# Patient Record
Sex: Male | Born: 1980
Health system: Southern US, Community
[De-identification: ages and names within clinical notes are randomized; demographics above are authoritative.]

## PROBLEM LIST (undated history)

## (undated) DIAGNOSIS — I10 Essential (primary) hypertension: Secondary | ICD-10-CM

## (undated) HISTORY — DX: Essential (primary) hypertension: I10

---

## 2000-07-04 ENCOUNTER — Encounter: Payer: Self-pay | Admitting: Internal Medicine

## 2000-07-04 ENCOUNTER — Emergency Department (HOSPITAL_COMMUNITY): Admission: EM | Admit: 2000-07-04 | Discharge: 2000-07-04 | Payer: Self-pay | Admitting: Internal Medicine

## 2003-11-18 ENCOUNTER — Emergency Department (HOSPITAL_COMMUNITY): Admission: EM | Admit: 2003-11-18 | Discharge: 2003-11-18 | Payer: Self-pay | Admitting: Emergency Medicine

## 2005-01-09 ENCOUNTER — Emergency Department (HOSPITAL_COMMUNITY): Admission: EM | Admit: 2005-01-09 | Discharge: 2005-01-09 | Payer: Self-pay | Admitting: Emergency Medicine

## 2007-11-27 ENCOUNTER — Emergency Department (HOSPITAL_COMMUNITY): Admission: EM | Admit: 2007-11-27 | Discharge: 2007-11-27 | Payer: Self-pay | Admitting: Emergency Medicine

## 2010-10-27 ENCOUNTER — Emergency Department (HOSPITAL_COMMUNITY)
Admission: EM | Admit: 2010-10-27 | Discharge: 2010-10-27 | Disposition: A | Discharge status: Home / Self Care | Payer: 59 | Attending: Emergency Medicine | Admitting: Emergency Medicine

## 2010-10-27 DIAGNOSIS — L509 Urticaria, unspecified: Secondary | ICD-10-CM | POA: Insufficient documentation

## 2012-09-06 ENCOUNTER — Ambulatory Visit (INDEPENDENT_AMBULATORY_CARE_PROVIDER_SITE_OTHER): Payer: 59 | Admitting: Sports Medicine

## 2012-09-06 ENCOUNTER — Encounter: Payer: Self-pay | Admitting: Sports Medicine

## 2012-09-06 VITALS — BP 160/110 | HR 58 | Wt 199.0 lb

## 2012-09-06 DIAGNOSIS — Z0000 Encounter for general adult medical examination without abnormal findings: Secondary | ICD-10-CM | POA: Insufficient documentation

## 2012-09-06 DIAGNOSIS — Z299 Encounter for prophylactic measures, unspecified: Secondary | ICD-10-CM

## 2012-09-06 DIAGNOSIS — I10 Essential (primary) hypertension: Secondary | ICD-10-CM

## 2012-09-06 DIAGNOSIS — N62 Hypertrophy of breast: Secondary | ICD-10-CM

## 2012-09-06 DIAGNOSIS — R718 Other abnormality of red blood cells: Secondary | ICD-10-CM

## 2012-09-06 DIAGNOSIS — Z87892 Personal history of anaphylaxis: Secondary | ICD-10-CM | POA: Insufficient documentation

## 2012-09-06 DIAGNOSIS — Z23 Encounter for immunization: Secondary | ICD-10-CM

## 2012-09-06 LAB — CBC
HCT: 43.1 % (ref 39.0–52.0)
Hemoglobin: 14.5 g/dL (ref 13.0–17.0)
MCH: 26.2 pg (ref 26.0–34.0)
MCHC: 33.6 g/dL (ref 30.0–36.0)
MCV: 77.8 fL — ABNORMAL LOW (ref 78.0–100.0)
Platelets: 254 10*3/uL (ref 150–400)
RBC: 5.54 MIL/uL (ref 4.22–5.81)
RDW: 14.4 % (ref 11.5–15.5)
WBC: 5 10*3/uL (ref 4.0–10.5)

## 2012-09-06 LAB — LIPID PANEL
Cholesterol: 170 mg/dL (ref 0–200)
HDL: 53 mg/dL (ref >39)
LDL Cholesterol: 99 mg/dL (ref 0–99)
Total CHOL/HDL Ratio: 3.2 Ratio
Triglycerides: 90 mg/dL (ref <150)
VLDL: 18 mg/dL (ref 0–40)

## 2012-09-06 LAB — COMPREHENSIVE METABOLIC PANEL
ALT: 15 U/L (ref 0–53)
AST: 24 U/L (ref 0–37)
Albumin: 4.7 g/dL (ref 3.5–5.2)
Alkaline Phosphatase: 78 U/L (ref 39–117)
BUN: 14 mg/dL (ref 6–23)
CO2: 28 mEq/L (ref 19–32)
Calcium: 10 mg/dL (ref 8.4–10.5)
Chloride: 101 mEq/L (ref 96–112)
Creat: 0.95 mg/dL (ref 0.50–1.35)
Glucose, Bld: 100 mg/dL — ABNORMAL HIGH (ref 70–99)
Potassium: 4.3 mEq/L (ref 3.5–5.3)
Sodium: 137 mEq/L (ref 135–145)
Total Bilirubin: 0.7 mg/dL (ref 0.3–1.2)
Total Protein: 7.6 g/dL (ref 6.0–8.3)

## 2012-09-06 LAB — TSH: TSH: 0.626 u[IU]/mL (ref 0.350–4.500)

## 2012-09-06 MED ORDER — LISINOPRIL-HYDROCHLOROTHIAZIDE 20-25 MG PO TABS: 0.5000 | ORAL_TABLET | Freq: Every day | ORAL | Status: DC

## 2012-09-06 MED ORDER — EPINEPHRINE 0.3 MG/0.3ML IJ DEVI: 0.3000 mg | Freq: Once | INTRAMUSCULAR | Status: DC

## 2012-09-06 NOTE — Assessment & Plan Note (Signed)
Very elevated, starting lisinopril/hydrochlorothiazide combo. Return to clinic in 2 weeks to recheck.

## 2012-09-06 NOTE — Progress Notes (Signed)
Subjective:    CC: Establish care, complete physical, discuss a few issues.   HPI: I met this pleasant gentleman at the [facility]. He comes in to establish care.  Hypertension: Very elevated, has never been treated, strong family history.  Gynecomastia: Notes right is larger than left, slowly growing, but has been present since his teen years. Wondering what can be done. No discharge.  History of anaphylaxis: Usually carries an EpiPen, needs refill on this.  Preventive measure: Needs a Tdap and flu shot.  Past medical history, Surgical history, Family history, Social history, Allergies, and medications have been entered into the medical record, reviewed, and no changes needed.   Review of Systems: No headache, visual changes, nausea, vomiting, diarrhea, constipation, dizziness, abdominal pain, skin rash, fevers, chills, night sweats, swollen lymph nodes, weight loss, chest pain, body aches, joint swelling, muscle aches, shortness of breath, mood changes, visual or auditory hallucinations.  Objective:    General: Well Developed, well nourished, and in no acute distress.  Neuro: Alert and oriented x3, extra-ocular muscles intact.  HEENT: Normocephalic, atraumatic, pupils equal round reactive to light, neck supple, no masses, no lymphadenopathy, thyroid nonpalpable.  Skin: Warm and dry, no rashes noted.  Cardiac: Regular rate and rhythm, no murmurs rubs or gallops.  Respiratory: Clear to auscultation bilaterally. Not using accessory muscles, speaking in full sentences. Gynecomastia bilaterally, right worse than left, nontender, no palpable masses. No discharge. Abdominal: Soft, nontender, nondistended, positive bowel sounds, no masses, no organomegaly.  Musculoskeletal: Shoulder, elbow, wrist, hip, knee, ankle stable, and with full range of motion.  Impression and Recommendations:    The patient was counselled, risk factors were discussed, anticipatory guidance given.

## 2012-09-06 NOTE — Assessment & Plan Note (Signed)
Advised on weight loss strategies. Offered referral to plastic surgery, he will defer this until later.

## 2012-09-06 NOTE — Assessment & Plan Note (Signed)
We will refill his EpiPen.

## 2012-09-07 LAB — VITAMIN D 25 HYDROXY (VIT D DEFICIENCY, FRACTURES): Vit D, 25-Hydroxy: 11 ng/mL — ABNORMAL LOW (ref 30–89)

## 2012-09-07 MED ORDER — VITAMIN D (ERGOCALCIFEROL) 1.25 MG (50000 UNIT) PO CAPS: 50000.0000 [IU] | ORAL_CAPSULE | ORAL | Status: DC

## 2012-09-07 NOTE — Addendum Note (Signed)
Addended by: Monica Becton on: 09/07/2012 08:05 AM   Modules accepted: Orders

## 2012-09-09 ENCOUNTER — Encounter: Payer: Self-pay | Admitting: *Deleted

## 2012-09-09 LAB — TESTOSTERONE, FREE, TOTAL, SHBG
Sex Hormone Binding: 36 nmol/L (ref 13–71)
Testosterone, Free: 100.1 pg/mL (ref 47.0–244.0)
Testosterone-% Free: 2 % (ref 1.6–2.9)
Testosterone: 502.25 ng/dL (ref 300–890)

## 2012-09-12 LAB — HEMOGLOBINOPATHY EVALUATION
Hemoglobin Other: 0 %
Hgb A2 Quant: 2.6 % (ref 2.2–3.2)
Hgb A: 97.4 % (ref 96.8–97.8)
Hgb F Quant: 0 % (ref 0.0–2.0)
Hgb S Quant: 0 %

## 2012-11-04 ENCOUNTER — Telehealth: Payer: Self-pay

## 2012-11-04 NOTE — Telephone Encounter (Signed)
He had an episode of palpitations. He stopped his lisinopril because he believes the Lisinopril was causing it. I tried to schedule patient for an appointment he stated he could not at this time and he would call back.   Awaiting a call back from patient to schedule him for an appointment.

## 2012-12-09 ENCOUNTER — Ambulatory Visit: Payer: 59 | Admitting: Sports Medicine

## 2012-12-18 ENCOUNTER — Encounter (HOSPITAL_COMMUNITY): Payer: Self-pay | Admitting: *Deleted

## 2012-12-18 ENCOUNTER — Emergency Department (HOSPITAL_COMMUNITY)
Admission: EM | Admit: 2012-12-18 | Discharge: 2012-12-18 | Disposition: A | Discharge status: Home / Self Care | Payer: 59 | Attending: Emergency Medicine | Admitting: Emergency Medicine

## 2012-12-18 DIAGNOSIS — R109 Unspecified abdominal pain: Secondary | ICD-10-CM

## 2012-12-18 DIAGNOSIS — Z79899 Other long term (current) drug therapy: Secondary | ICD-10-CM | POA: Insufficient documentation

## 2012-12-18 DIAGNOSIS — R1084 Generalized abdominal pain: Secondary | ICD-10-CM | POA: Insufficient documentation

## 2012-12-18 DIAGNOSIS — I10 Essential (primary) hypertension: Secondary | ICD-10-CM | POA: Insufficient documentation

## 2012-12-18 DIAGNOSIS — R112 Nausea with vomiting, unspecified: Secondary | ICD-10-CM | POA: Insufficient documentation

## 2012-12-18 LAB — URINALYSIS, ROUTINE W REFLEX MICROSCOPIC
Glucose, UA: NEGATIVE mg/dL
Hgb urine dipstick: NEGATIVE
Ketones, ur: 15 mg/dL — AB
Leukocytes, UA: NEGATIVE
pH: 8 (ref 5.0–8.0)

## 2012-12-18 LAB — COMPREHENSIVE METABOLIC PANEL
ALT: 15 U/L (ref 0–53)
AST: 16 U/L (ref 0–37)
Albumin: 3.7 g/dL (ref 3.5–5.2)
CO2: 27 mEq/L (ref 19–32)
Chloride: 102 mEq/L (ref 96–112)
Creatinine, Ser: 0.77 mg/dL (ref 0.50–1.35)
Potassium: 3.7 mEq/L (ref 3.5–5.1)
Sodium: 137 mEq/L (ref 135–145)
Total Bilirubin: 0.6 mg/dL (ref 0.3–1.2)

## 2012-12-18 LAB — CBC WITH DIFFERENTIAL/PLATELET
Basophils Absolute: 0 10*3/uL (ref 0.0–0.1)
Basophils Relative: 0 % (ref 0–1)
Lymphocytes Relative: 5 % — ABNORMAL LOW (ref 12–46)
MCHC: 33.8 g/dL (ref 30.0–36.0)
Neutro Abs: 7 10*3/uL (ref 1.7–7.7)
Neutrophils Relative %: 93 % — ABNORMAL HIGH (ref 43–77)
Platelets: 207 10*3/uL (ref 150–400)
RDW: 13.5 % (ref 11.5–15.5)
WBC: 7.5 10*3/uL (ref 4.0–10.5)

## 2012-12-18 LAB — URINE MICROSCOPIC-ADD ON

## 2012-12-18 MED ORDER — FAMOTIDINE IN NACL 20-0.9 MG/50ML-% IV SOLN
20.0000 mg | Freq: Once | INTRAVENOUS | Status: AC
  Administered 2012-12-18: 20 mg via INTRAVENOUS
  Filled 2012-12-18: qty 50

## 2012-12-18 MED ORDER — HYDROMORPHONE HCL PF 1 MG/ML IJ SOLN
0.5000 mg | Freq: Once | INTRAMUSCULAR | Status: AC
  Administered 2012-12-18: 0.5 mg via INTRAVENOUS
  Filled 2012-12-18: qty 1

## 2012-12-18 MED ORDER — GI COCKTAIL ~~LOC~~
30.0000 mL | Freq: Once | ORAL | Status: AC
  Administered 2012-12-18: 30 mL via ORAL
  Filled 2012-12-18: qty 30

## 2012-12-18 MED ORDER — ONDANSETRON HCL 4 MG/2ML IJ SOLN
4.0000 mg | Freq: Once | INTRAMUSCULAR | Status: AC
  Administered 2012-12-18: 4 mg via INTRAVENOUS
  Filled 2012-12-18: qty 2

## 2012-12-18 MED ORDER — METOCLOPRAMIDE HCL 10 MG PO TABS: 10.0000 mg | ORAL_TABLET | Freq: Three times a day (TID) | ORAL | Status: DC | PRN

## 2012-12-18 MED ORDER — SODIUM CHLORIDE 0.9 % IV SOLN
1000.0000 mL | Freq: Once | INTRAVENOUS | Status: AC
  Administered 2012-12-18: 1000 mL via INTRAVENOUS

## 2012-12-18 NOTE — ED Provider Notes (Signed)
History     CSN: [id number]  Arrival date & time 12/18/12  4098   First MD Initiated Contact with Patient 12/18/12 [phone]      Chief Complaint  Patient presents with  . Emesis  . Nausea  . Abdominal Pain    (Consider location/radiation/quality/duration/timing/severity/associated sxs/prior treatment) HPI  The patient presents with abdominal pain, nausea, vomiting. Symptoms began approximately 12 hours ago. The patient does not recall significant changes in behavior, diet, medication prior to the onset of symptoms.  He did go out to dinner, though to a typical restaurant for him. Since onset he has had diffuse crampy abdominal pain, worse in the lower abdomen there is also nausea with vomiting.  No diarrhea. He continues to have essentially normal bowel movements. He denies fevers, chills, dyspnea.   Past Medical History  Diagnosis Date  . Hypertension     History reviewed. No pertinent past surgical history.  Family History  Problem Relation Age of Onset  . Cancer Mother   . Heart disease Mother   . Hypertension Mother   . Hyperlipidemia Mother   . Stroke Mother   . Diabetes Mother   . Diabetes Maternal Aunt   . Hyperlipidemia Maternal Aunt   . Hypertension Maternal Aunt   . Diabetes Maternal Uncle   . Diabetes Maternal Grandmother   . Hypertension Maternal Grandmother   . Stroke Maternal Grandmother   . Diabetes Maternal Grandfather   . Hypertension Maternal Grandfather   . Stroke Maternal Grandfather     History  Substance Use Topics  . Smoking status: Never Smoker   . Smokeless tobacco: Not on file  . Alcohol Use: Yes     Comment: socially      Review of Systems  Constitutional:       Per HPI, otherwise negative  HENT:       Per HPI, otherwise negative  Respiratory:       Per HPI, otherwise negative  Cardiovascular:       Per HPI, otherwise negative  Gastrointestinal: Positive for nausea, vomiting and abdominal pain. Negative for diarrhea.   Endocrine:       Negative aside from HPI  Genitourinary:       Neg aside from HPI   Musculoskeletal:       Per HPI, otherwise negative  Skin: Negative.   Neurological: Negative for syncope.    Allergies  Review of patient's allergies indicates no known allergies.  Home Medications   Current Outpatient Rx  Name  Route  Sig  Dispense  Refill  . EPINEPHrine (EPIPEN) 0.3 mg/0.3 mL DEVI   Subcutaneous   Inject 0.3 mLs (0.3 mg total) into the skin once.   1 Device   0   . lisinopril-hydrochlorothiazide (PRINZIDE,ZESTORETIC) 20-25 MG per tablet   Oral   Take 0.5 tablets by mouth daily.   30 tablet   3   . Vitamin D, Ergocalciferol, (DRISDOL) 50000 UNITS CAPS   Oral   Take 1 capsule (50,000 Units total) by mouth every 7 (seven) days.   8 capsule   0     BP 151/97  Pulse 90  Temp(Src) 99.2 F (37.3 C) (Oral)  Resp 17  SpO2 99%  Physical Exam  Nursing note and vitals reviewed. Constitutional: He is oriented to person, place, and time. He appears well-developed. No distress.  HENT:  Head: Normocephalic and atraumatic.  Eyes: Conjunctivae and EOM are normal.  Cardiovascular: Normal rate and regular rhythm.   Pulmonary/Chest: Effort normal. No  stridor. No respiratory distress.  Abdominal: Normal appearance and bowel sounds are normal. He exhibits no distension. There is no hepatosplenomegaly or hepatomegaly. There is generalized tenderness. There is no rigidity, no rebound, no guarding and no CVA tenderness.  Musculoskeletal: He exhibits no edema.  Neurological: He is alert and oriented to person, place, and time.  Skin: Skin is warm and dry.  Psychiatric: He has a normal mood and affect.    ED Course  Procedures (including critical care time)  Labs Reviewed  CBC WITH DIFFERENTIAL  COMPREHENSIVE METABOLIC PANEL  LIPASE, BLOOD  URINALYSIS, ROUTINE W REFLEX MICROSCOPIC   No results found.   No diagnosis found.  11:55 AM Pacemaker substantially better.   Labs reviewed with him. He is tolerating liquids. MDM  This young male presents with new abdominal pain.  The patient is afebrile, uncomfortable, but in no distress with a soft, non-peritoneal abdomen.  The patient's symptoms improve substantially with antiemetics, fluids.  Labs are notable for ketonuria, but no suggestion of systemic illness.  Absent fever, distress, with the improvement in his condition he was discharged with return precautions, follow up instructions.  There is low suspicion for acute appendicitis, diverticulitis, or other acute intra-abdominal pathology, given concern for early presentation again, return precautions were provided.   Gerhard Munch, MD 12/18/12 1158

## 2012-12-18 NOTE — ED Notes (Signed)
Pt. Given a urinal. 

## 2012-12-18 NOTE — ED Notes (Signed)
Pt reports n/v, abd pain since last night. Emesis x3 in last 12hrs. Denies diarrhea. Pain worse in lower abd. Has not tried any meds at home. Denies loc, +lightheadedness.

## 2012-12-18 NOTE — ED Notes (Signed)
Pt given water as PO challenge. Reminded of need for urine sample.

## 2012-12-18 NOTE — ED Notes (Signed)
Pt given ginger ale for additional PO challenge. Reports HA. Sts the last time he ate was last night, HA could be from not eating.

## 2012-12-18 NOTE — ED Notes (Signed)
Pt reports nausea is better but still present, endorses burping often. Reports pain is only a headache now. Was able to tolerate water as PO challenge.

## 2013-06-09 ENCOUNTER — Ambulatory Visit (INDEPENDENT_AMBULATORY_CARE_PROVIDER_SITE_OTHER): Payer: 59 | Admitting: Sports Medicine

## 2013-06-09 ENCOUNTER — Encounter: Payer: Self-pay | Admitting: Sports Medicine

## 2013-06-09 VITALS — BP 160/107 | HR 58 | Ht 71.0 in | Wt 202.0 lb

## 2013-06-09 DIAGNOSIS — Z23 Encounter for immunization: Secondary | ICD-10-CM

## 2013-06-09 DIAGNOSIS — Z299 Encounter for prophylactic measures, unspecified: Secondary | ICD-10-CM

## 2013-06-09 DIAGNOSIS — Z0000 Encounter for general adult medical examination without abnormal findings: Secondary | ICD-10-CM

## 2013-06-09 DIAGNOSIS — I10 Essential (primary) hypertension: Secondary | ICD-10-CM

## 2013-06-09 MED ORDER — LISINOPRIL-HYDROCHLOROTHIAZIDE 20-25 MG PO TABS: 0.5000 | ORAL_TABLET | Freq: Every day | ORAL | Status: DC

## 2013-06-09 NOTE — Progress Notes (Signed)
  Subjective:    CC: Complete physical  HPI:  Hypertension: Has not been back for a return visit since starting his blood pressure medicine, he has been out probably for 5 months now. He is asymptomatic, blood pressure is very elevated.  Preventative measures: Due for a flu shot, fasting blood sugar, lipids were good. BMI is in the overweight range.  Past medical history, Surgical history, Family history not pertinant except as noted below, Social history, Allergies, and medications have been entered into the medical record, reviewed, and no changes needed.   Review of Systems: No headache, visual changes, nausea, vomiting, diarrhea, constipation, dizziness, abdominal pain, skin rash, fevers, chills, night sweats, swollen lymph nodes, weight loss, chest pain, body aches, joint swelling, muscle aches, shortness of breath, mood changes, visual or auditory hallucinations.  Objective:    General: Well Developed, well nourished, and in no acute distress.  Neuro: Alert and oriented x3, extra-ocular muscles intact, sensation grossly intact.  HEENT: Normocephalic, atraumatic, pupils equal round reactive to light, neck supple, no masses, no lymphadenopathy, thyroid nonpalpable.  Skin: Warm and dry, no rashes noted.  Cardiac: Regular rate and rhythm, no murmurs rubs or gallops.  Respiratory: Clear to auscultation bilaterally. Not using accessory muscles, speaking in full sentences.  Abdominal: Soft, nontender, nondistended, positive bowel sounds, no masses, no organomegaly.  Musculoskeletal: Shoulder, elbow, wrist, hip, knee, ankle stable, and with full range of motion. Impression and Recommendations:    The patient was counselled, risk factors were discussed, anticipatory guidance given.

## 2013-06-09 NOTE — Assessment & Plan Note (Signed)
Complete physical performed today. Biometrics screening sheet filled out today.

## 2013-06-09 NOTE — Assessment & Plan Note (Signed)
Extremely uncontrolled. He did not followup after blood pressure medicine was prescribed. Refilled blood pressure medicine, he needs to see me every 2 weeks until blood pressure is controlled.

## 2013-06-26 ENCOUNTER — Ambulatory Visit: Payer: 59 | Admitting: Sports Medicine

## 2014-05-06 ENCOUNTER — Ambulatory Visit: Payer: 59 | Admitting: Sports Medicine

## 2014-08-18 ENCOUNTER — Ambulatory Visit: Payer: 59 | Admitting: Sports Medicine

## 2014-08-19 ENCOUNTER — Other Ambulatory Visit: Payer: Self-pay | Admitting: Sports Medicine

## 2014-08-21 ENCOUNTER — Encounter: Payer: 59 | Admitting: Family Medicine

## 2017-03-27 ENCOUNTER — Encounter (HOSPITAL_COMMUNITY): Payer: Self-pay | Admitting: Emergency Medicine

## 2017-03-27 ENCOUNTER — Emergency Department (HOSPITAL_COMMUNITY)
Admission: EM | Admit: 2017-03-27 | Discharge: 2017-03-27 | Disposition: A | Discharge status: Home / Self Care | Payer: 59 | Attending: Emergency Medicine | Admitting: Emergency Medicine

## 2017-03-27 DIAGNOSIS — M791 Myalgia: Secondary | ICD-10-CM | POA: Diagnosis present

## 2017-03-27 DIAGNOSIS — B349 Viral infection, unspecified: Secondary | ICD-10-CM | POA: Insufficient documentation

## 2017-03-27 DIAGNOSIS — I10 Essential (primary) hypertension: Secondary | ICD-10-CM | POA: Insufficient documentation

## 2017-03-27 DIAGNOSIS — Z79899 Other long term (current) drug therapy: Secondary | ICD-10-CM | POA: Diagnosis not present

## 2017-03-27 MED ORDER — IBUPROFEN 200 MG PO TABS
600.0000 mg | ORAL_TABLET | Freq: Once | ORAL | Status: AC
  Administered 2017-03-27: 600 mg via ORAL
  Filled 2017-03-27: qty 3

## 2017-03-27 MED ORDER — ONDANSETRON 4 MG PO TBDP
4.0000 mg | ORAL_TABLET | Freq: Once | ORAL | Status: AC
  Administered 2017-03-27: 4 mg via ORAL
  Filled 2017-03-27: qty 1

## 2017-03-27 MED ORDER — ONDANSETRON HCL 4 MG PO TABS: 4.0000 mg | ORAL_TABLET | Freq: Three times a day (TID) | ORAL | 0 refills | Status: DC | PRN

## 2017-03-27 NOTE — Discharge Instructions (Addendum)
Please read instructions below. You can take 600mg  of advil/ibuprofen every 6 hours as needed for body aches. Drink plenty of water. You can take zofran every 8 hours as needed for nausea. Schedule an appointment with your primary care provider if symptoms persist. Return to the ER for severe headache or neck stiffness, vision changes, high uncontrollable fever, or new or concerning symptoms.

## 2017-03-27 NOTE — ED Triage Notes (Signed)
Patient reports that yesterday he got really hot, sweaty and felt weak with body aches.  Patient reports that he had high blood pressure 167/100 yesterday even after taking HTN meds.  Patient states that today he still having body aches and sore muscles and "already called out of work for today".

## 2017-03-27 NOTE — ED Provider Notes (Signed)
[facility]-EMERGENCY DEPT Provider Note   CSN: [id number] Arrival date & time: 03/27/17  1205     History   Chief Complaint Chief Complaint  Patient presents with  . Generalized Body Aches    HPI Gregory Willis is a 36 y.o. male with past medical history of hypertension.  HPI  Patient presenting with acute onset of persistent generalized myalgias that began yesterday. He states it started with a posterior headache with some bilateral ear fullness and neck pain. Denies vomiting, diarrhea, constipation, melena or hematochezia, sore throat, cough, congestion, chest pain, shortness of breath, rash, vision changes. Has not taken medication for symptoms. States he takes half a tablet of lisinopril once a day for his blood pressure, that has been running a little high the past 2 days at 160/100. Denies recent insect or tick bite. No sick contacts.  Past Medical History:  Diagnosis Date  . Hypertension     Patient Active Problem List   Diagnosis Date Noted  . Hypertension 09/06/2012  . Gynecomastia 09/06/2012  . Preventive measure 09/06/2012  . History of anaphylaxis 09/06/2012    History reviewed. No pertinent surgical history.     Home Medications    Prior to Admission medications   Medication Sig Start Date End Date Taking? Authorizing Provider  acetaminophen (TYLENOL) 500 MG tablet Take 1,000 mg by mouth every 6 (six) hours as needed for mild pain or headache.   Yes [provider]  EPINEPHrine (EPIPEN) 0.3 mg/0.3 mL DEVI Inject 0.3 mLs (0.3 mg total) into the skin once. 09/06/12  Yes Monica Bectonhekkekandam, Thomas J, MD  lisinopril-hydrochlorothiazide (PRINZIDE,ZESTORETIC) 20-25 MG per tablet TAKE ONE-HALF TABLET BY MOUTH ONCE DAILY 08/19/14  Yes Monica Bectonhekkekandam, Thomas J, MD  ondansetron (ZOFRAN) 4 MG tablet Take 1 tablet (4 mg total) by mouth every 8 (eight) hours as needed for nausea or vomiting. 03/27/17   Russo, [place] N, PA-C    Family History Family History  Problem  Relation Age of Onset  . Cancer Mother   . Heart disease Mother   . Hypertension Mother   . Hyperlipidemia Mother   . Stroke Mother   . Diabetes Mother   . Diabetes Maternal Aunt   . Hyperlipidemia Maternal Aunt   . Hypertension Maternal Aunt   . Diabetes Maternal Uncle   . Diabetes Maternal Grandmother   . Hypertension Maternal Grandmother   . Stroke Maternal Grandmother   . Diabetes Maternal Grandfather   . Hypertension Maternal Grandfather   . Stroke Maternal Grandfather     Social History Social History  Substance Use Topics  . Smoking status: Never Smoker  . Smokeless tobacco: Never Used  . Alcohol use Yes     Comment: socially     Allergies   Patient has no known allergies.   Review of Systems Review of Systems  Constitutional: Positive for fever (tmax 102). Negative for appetite change.  HENT: Negative for congestion and sore throat.   Eyes: Negative for photophobia and visual disturbance.  Respiratory: Negative for cough and shortness of breath.   Cardiovascular: Negative for chest pain.  Gastrointestinal: Positive for nausea. Negative for abdominal pain, blood in stool, constipation, diarrhea and vomiting.  Genitourinary: Negative for dysuria and frequency.  Musculoskeletal: Positive for neck pain.  Skin: Negative for rash.  Neurological: Positive for headaches.     Physical Exam Updated Vital Signs BP (!) 136/105 (BP Location: Right Arm)   Pulse (!) 54   Temp 98.2 F (36.8 C) (Oral)   Resp 16  SpO2 97%   Physical Exam  Constitutional: He appears well-developed and well-nourished.  HENT:  Head: Normocephalic and atraumatic.  Right Ear: Tympanic membrane, external ear and ear canal normal.  Left Ear: Tympanic membrane, external ear and ear canal normal.  Nose: Nose normal.  Mouth/Throat: Uvula is midline, oropharynx is clear and moist and mucous membranes are normal. No trismus in the jaw. No uvula swelling.  Eyes: Pupils are equal, round, and  reactive to light. Conjunctivae and EOM are normal.  Neck: Normal range of motion. Neck supple.  No nuchal rigidity  Cardiovascular: Normal rate, regular rhythm, normal heart sounds and intact distal pulses.   No murmur heard. Pulmonary/Chest: Effort normal and breath sounds normal. No respiratory distress. He has no wheezes. He has no rales. He exhibits no tenderness.  Abdominal: Soft. Bowel sounds are normal. He exhibits no distension and no mass. There is no tenderness. There is no rebound and no guarding. No hernia.  Lymphadenopathy:    He has no cervical adenopathy.  Psychiatric: He has a normal mood and affect. His behavior is normal.  Nursing note and vitals reviewed.    ED Treatments / Results  Labs (all labs ordered are listed, but only abnormal results are displayed) Labs Reviewed - No data to display  EKG  EKG Interpretation None       Radiology No results found.  Procedures Procedures (including critical care time)  Medications Ordered in ED Medications  ondansetron (ZOFRAN-ODT) disintegrating tablet 4 mg (4 mg Oral Given 03/27/17 1338)  ibuprofen (ADVIL,MOTRIN) tablet 600 mg (600 mg Oral Given 03/27/17 1338)     Initial Impression / Assessment and Plan / ED Course  I have reviewed the triage vital signs and the nursing notes.  Pertinent labs & imaging results that were available during my care of the patient were reviewed by me and considered in my medical decision making (see chart for details).     Patients presenting with myalgias and headache. Exam reassuring, ENT exam benign, lungs CTAB, abd exam benign. No nuchal rigidity. Pt is afebrile, nontoxic. Pt with improvement in symptoms after advil and zofran. Discussed that antibiotics are not indicated for viral infections. Pt will be discharged with symptomatic treatment.  Verbalizes understanding and is agreeable with plan. Pt is hemodynamically stable & in NAD prior to dc.  Patient discussed with and  seen by Dr. Adriana Simas.  Discussed results, findings, treatment and follow up. Patient advised of return precautions. Patient verbalized understanding and agreed with plan.  Final Clinical Impressions(s) / ED Diagnoses   Final diagnoses:  Viral illness    New Prescriptions Discharge Medication List as of 03/27/2017  3:20 PM    START taking these medications   Details  ondansetron (ZOFRAN) 4 MG tablet Take 1 tablet (4 mg total) by mouth every 8 (eight) hours as needed for nausea or vomiting., Starting Tue 03/27/2017, Print         Timothy Lasso [place] N, PA-C 03/27/17 1556    Donnetta Hutching, MD 03/28/17 1311

## 2017-05-17 ENCOUNTER — Encounter: Payer: Self-pay | Admitting: Family Medicine

## 2017-05-17 ENCOUNTER — Encounter (INDEPENDENT_AMBULATORY_CARE_PROVIDER_SITE_OTHER): Payer: Self-pay

## 2017-05-17 ENCOUNTER — Ambulatory Visit (INDEPENDENT_AMBULATORY_CARE_PROVIDER_SITE_OTHER): Payer: 59 | Admitting: Family Medicine

## 2017-05-17 VITALS — BP 155/100 | HR 56 | Temp 98.1°F | Resp 16 | Ht 71.65 in | Wt 207.4 lb

## 2017-05-17 DIAGNOSIS — M791 Myalgia, unspecified site: Secondary | ICD-10-CM

## 2017-05-17 DIAGNOSIS — S76211A Strain of adductor muscle, fascia and tendon of right thigh, initial encounter: Secondary | ICD-10-CM | POA: Diagnosis not present

## 2017-05-17 MED ORDER — ACETAMINOPHEN 500 MG PO TABS: 1000.0000 mg | ORAL_TABLET | Freq: Four times a day (QID) | ORAL | 0 refills | Status: DC | PRN

## 2017-05-17 MED ORDER — CYCLOBENZAPRINE HCL 10 MG PO TABS: 10.0000 mg | ORAL_TABLET | Freq: Three times a day (TID) | ORAL | 0 refills | Status: DC | PRN

## 2017-05-17 NOTE — Patient Instructions (Addendum)
Try a heat pad or aspercreme with lidocaine  Adductor Muscle Strain With Rehab Ask your health care provider which exercises are safe for you. Do exercises exactly as told by your health care provider and adjust them as directed. It is normal to feel mild stretching, pulling, tightness, or discomfort as you do these exercises, but you should stop right away if you feel sudden pain or your pain gets worse.Do not begin these exercises until told by your health care provider. Strengthening exercises These exercises build strength and endurance in your thigh. Endurance is the ability to use your muscles for a long time, even after your muscles get tired. Exercise A: Hip adductor isometrics  1. Sit on a firm chair that positions your knees at about the same height as your hips. 2. Place a large ball, firm pillow, or rolled-up bath towel between your thighs. 3. Squeeze your thighs together, gradually building tension. 4. Hold for __________ seconds. 5. Release the tension gradually. Allow your inner thigh muscles to relax completely before you start the next repetition. Repeat __________ times. Complete this exercise __________ times a day. Exercise B: Straight leg raises ( hip adductors) 1. Lie on your side so your head, shoulder, knee, and hip are in a straight line with each other. To help maintain your balance, you may put the foot of your top leg in front of the leg that is on the floor. Your left / right leg should be on the bottom. 2. Roll your hips slightly forward so your hips are stacked directly over each other and your left / right knee is facing forward. 3. Tense the muscles of your inner thigh and lift your bottom leg 4-6 inches (10-15 cm). 4. Hold this position for __________ seconds. 5. Slowly lower your leg to the starting position. 6. Allow the muscles to relax completely before you start the next repetition. Repeat __________ times. Complete this exercise __________ times a  day. Exercise C: Straight leg raises ( hip extensors) 1. Lie on your belly on a bed or a firm surface with a pillow under your hips. 2. Tense your buttock muscles and lift your left / right thigh off the bed. Your left / right knee can be bent or straight, but do not let your back arch. 3. Hold this position for __________ seconds. 4. Slowly return to the starting position. 5. Allow your muscles to relax completely before you start the next repetition. Repeat __________ times. Complete this exercise __________ times a day. Balance exercises These exercises improve or maintain your balance. Balance is important in preventing falls. Exercise D: Single-leg balance 1. Stand near a railing or in a door frame that you can hold onto as needed. 2. Stand on your left / right foot. Keep your big toe down on the floor and try to keep your arch lifted. 3. If this is too easy, you can stand with your eyes closed or stand on a pillow. 4. Hold this position for __________ seconds. Repeat __________ times. Complete this exercise __________ times a day. Exercise E: Side lunges 1. Stand with your feet together. 2. Keeping one foot in place, step to the side with your other foot about __________ inches (__________ cm). Do not step so far that you feel discomfort. 3. Push off from your stepping foot to return to the starting position. Repeat __________ times on each leg. Complete this exercise __________ times a day. This information is not intended to replace advice given to you by  your health care provider. Make sure you discuss any questions you have with your health care provider. Document Released: 08/28/2005 Document Revised: 05/01/2016 Document Reviewed: 06/01/2015 Elsevier Interactive Patient Education  [employer].

## 2017-05-17 NOTE — Progress Notes (Signed)
Chief Complaint  Patient presents with  . Leg Pain    patient states that he began having some R side leg numbness after sitting at work for 2 hours, states yesterday he felt some pain and heard a "pop" while putting on shoes, difficulty walking now    HPI  Pt with leg pain that was on the right sie of the leg with numbness Sitting at work for 2 hours aggravated this Yesterday he had some pain  Heard a pop when he was putting his shoes on Now he is limping No trauma or falls  Past Medical History:  Diagnosis Date  . Hypertension     Current Outpatient Medications  Medication Sig Dispense Refill  . acetaminophen (TYLENOL) 500 MG tablet Take 2 tablets (1,000 mg total) by mouth every 6 (six) hours as needed for mild pain or headache. 30 tablet 0  . EPINEPHrine (EPIPEN) 0.3 mg/0.3 mL DEVI Inject 0.3 mLs (0.3 mg total) into the skin once. 1 Device 0  . lisinopril-hydrochlorothiazide (PRINZIDE,ZESTORETIC) 20-25 MG per tablet TAKE ONE-HALF TABLET BY MOUTH ONCE DAILY 30 tablet 0  . cyclobenzaprine (FLEXERIL) 10 MG tablet Take 1 tablet (10 mg total) by mouth 3 (three) times daily as needed for muscle spasms. 30 tablet 0  . ondansetron (ZOFRAN) 4 MG tablet Take 1 tablet (4 mg total) by mouth every 8 (eight) hours as needed for nausea or vomiting. (Patient not taking: Reported on 05/17/2017) 12 tablet 0   No current facility-administered medications for this visit.     Allergies: No Known Allergies  No past surgical history on file.  Social History   Socioeconomic History  . Marital status: Single    Spouse name: None  . Number of children: None  . Years of education: None  . Highest education level: None  Social Needs  . Financial resource strain: None  . Food insecurity - worry: None  . Food insecurity - inability: None  . Transportation needs - medical: None  . Transportation needs - non-medical: None  Occupational History  . None  Tobacco Use  . Smoking status: Never  Smoker  . Smokeless tobacco: Never Used  Substance and Sexual Activity  . Alcohol use: Yes    Comment: socially  . Drug use: No  . Sexual activity: Not Currently  Other Topics Concern  . None  Social History Narrative  . None    ROS  Objective: Vitals:   05/17/17 1702  BP: (!) 155/100  Pulse: (!) 56  Resp: 16  Temp: 98.1 F (36.7 C)  TempSrc: Oral  SpO2: 98%  Weight: 207 lb 6.4 oz (94.1 kg)  Height: 5' 11.65" (1.82 m)    Physical Exam  Constitutional: He is oriented to person, place, and time. He appears well-developed and well-nourished.  HENT:  Head: Normocephalic and atraumatic.  Cardiovascular: Normal rate, regular rhythm and normal heart sounds.  Pulmonary/Chest: Effort normal and breath sounds normal. No stridor. No respiratory distress.  Musculoskeletal:       Right upper leg: He exhibits tenderness. He exhibits no bony tenderness, no swelling, no edema, no deformity and no laceration.       Left upper leg: Normal. He exhibits no tenderness, no bony tenderness and no swelling.  Neurological: He is alert and oriented to person, place, and time.    Assessment and Plan Carmon was seen today for leg pain.  Diagnoses and all orders for this visit:  Muscle pain Groin strain, right, initial encounter  Other orders -  Discontinue: cyclobenzaprine (FLEXERIL) 10 MG tablet; Take 1 tablet (10 mg total) by mouth 3 (three) times daily as needed for muscle spasms. -     acetaminophen (TYLENOL) 500 MG tablet; Take 2 tablets (1,000 mg total) by mouth every 6 (six) hours as needed for mild pain or headache. -     cyclobenzaprine (FLEXERIL) 10 MG tablet; Take 1 tablet (10 mg total) by mouth 3 (three) times daily as needed for muscle spasms.     Lewanda Perea A Aleksandra Raben

## 2017-08-09 ENCOUNTER — Emergency Department (HOSPITAL_BASED_OUTPATIENT_CLINIC_OR_DEPARTMENT_OTHER)
Admission: EM | Admit: 2017-08-09 | Discharge: 2017-08-09 | Disposition: A | Discharge status: Home / Self Care | Payer: 59 | Attending: Emergency Medicine | Admitting: Emergency Medicine

## 2017-08-09 ENCOUNTER — Encounter (HOSPITAL_BASED_OUTPATIENT_CLINIC_OR_DEPARTMENT_OTHER): Payer: Self-pay | Admitting: *Deleted

## 2017-08-09 ENCOUNTER — Other Ambulatory Visit: Payer: Self-pay

## 2017-08-09 DIAGNOSIS — Y999 Unspecified external cause status: Secondary | ICD-10-CM | POA: Diagnosis not present

## 2017-08-09 DIAGNOSIS — Y939 Activity, unspecified: Secondary | ICD-10-CM | POA: Insufficient documentation

## 2017-08-09 DIAGNOSIS — X500XXA Overexertion from strenuous movement or load, initial encounter: Secondary | ICD-10-CM | POA: Diagnosis not present

## 2017-08-09 DIAGNOSIS — S76801A Unspecified injury of other specified muscles, fascia and tendons at thigh level, right thigh, initial encounter: Secondary | ICD-10-CM | POA: Diagnosis present

## 2017-08-09 DIAGNOSIS — I10 Essential (primary) hypertension: Secondary | ICD-10-CM | POA: Diagnosis not present

## 2017-08-09 DIAGNOSIS — Z79899 Other long term (current) drug therapy: Secondary | ICD-10-CM | POA: Insufficient documentation

## 2017-08-09 DIAGNOSIS — Y929 Unspecified place or not applicable: Secondary | ICD-10-CM | POA: Diagnosis not present

## 2017-08-09 DIAGNOSIS — S76211A Strain of adductor muscle, fascia and tendon of right thigh, initial encounter: Secondary | ICD-10-CM

## 2017-08-09 MED ORDER — NAPROXEN 500 MG PO TABS: 500.0000 mg | ORAL_TABLET | Freq: Two times a day (BID) | ORAL | 0 refills | Status: DC

## 2017-08-09 MED ORDER — CYCLOBENZAPRINE HCL 10 MG PO TABS: 10.0000 mg | ORAL_TABLET | Freq: Two times a day (BID) | ORAL | 0 refills | Status: DC | PRN

## 2017-08-09 MED FILL — CYCLOBENZAPRINE 10 MG TABLE: 10 | 10 days supply | Qty: 20 | Fill #0

## 2017-08-09 MED FILL — NAPROXEN 500 MG TABLET: 500 | 15 days supply | Qty: 30 | Fill #0

## 2017-08-09 NOTE — ED Provider Notes (Signed)
[facility] EMERGENCY DEPARTMENT Provider Note   CSN: [id number] Arrival date & time: 08/09/17  1130     History   Chief Complaint Chief Complaint  Patient presents with  . Leg Pain    HPI Gregory Willis is a 36 y.o. male.  HPI Gregory Willis is a 36 y.o. male with history of hypertension, presents to emergency department complaining of right groin pain.  Patient states he was at work, when suddenly started having pain in the right groin.  He states he had similar pain several weeks ago, was treated with muscle relaxants and Tylenol.  He states pain did improve.  He denies any injuries.  He denies any swelling in the groin.  No hematuria.  No trouble having bowel movements.  No fever or chills.  No penile discharge.  He states it does hurt when he flexes his right hip, when he walks, when he moves his leg.  He denies history of hernias.  He denies any pain or swelling in his scrotum.  He took his last leftover muscle relaxant prior to coming in which did not help.  Past Medical History:  Diagnosis Date  . Hypertension     Patient Active Problem List   Diagnosis Date Noted  . Hypertension 09/06/2012  . Gynecomastia 09/06/2012  . Preventive measure 09/06/2012  . History of anaphylaxis 09/06/2012    History reviewed. No pertinent surgical history.     Home Medications    Prior to Admission medications   Medication Sig Start Date End Date Taking? Authorizing Provider  lisinopril-hydrochlorothiazide (PRINZIDE,ZESTORETIC) 20-25 MG per tablet TAKE ONE-HALF TABLET BY MOUTH ONCE DAILY 08/19/14  Yes Monica Bectonhekkekandam, Thomas J, MD  acetaminophen (TYLENOL) 500 MG tablet Take 2 tablets (1,000 mg total) by mouth every 6 (six) hours as needed for mild pain or headache. 05/17/17   Doristine BosworthStallings, Zoe A, MD  cyclobenzaprine (FLEXERIL) 10 MG tablet Take 1 tablet (10 mg total) by mouth 3 (three) times daily as needed for muscle spasms. 05/17/17   Doristine BosworthStallings, Zoe A, MD  EPINEPHrine (EPIPEN)  0.3 mg/0.3 mL DEVI Inject 0.3 mLs (0.3 mg total) into the skin once. 09/06/12   Monica Bectonhekkekandam, Thomas J, MD  ondansetron (ZOFRAN) 4 MG tablet Take 1 tablet (4 mg total) by mouth every 8 (eight) hours as needed for nausea or vomiting. Patient not taking: Reported on 05/17/2017 03/27/17   Robinson, [place]az[place] N, PA-C    Family History Family History  Problem Relation Age of Onset  . Cancer Mother   . Heart disease Mother   . Hypertension Mother   . Hyperlipidemia Mother   . Stroke Mother   . Diabetes Mother   . Diabetes Maternal Aunt   . Hyperlipidemia Maternal Aunt   . Hypertension Maternal Aunt   . Diabetes Maternal Uncle   . Diabetes Maternal Grandmother   . Hypertension Maternal Grandmother   . Stroke Maternal Grandmother   . Diabetes Maternal Grandfather   . Hypertension Maternal Grandfather   . Stroke Maternal Grandfather     Social History Social History   Tobacco Use  . Smoking status: Never Smoker  . Smokeless tobacco: Never Used  Substance Use Topics  . Alcohol use: Yes    Comment: socially  . Drug use: No     Allergies   Patient has no known allergies.   Review of Systems Review of Systems  Constitutional: Negative for chills and fever.  Gastrointestinal: Negative for abdominal pain, constipation, nausea and vomiting.  Genitourinary: Negative  for difficulty urinating, discharge, dysuria, hematuria, penile pain, penile swelling, scrotal swelling and testicular pain.  Musculoskeletal: Positive for arthralgias and myalgias. Negative for back pain.  Skin: Negative for color change.  Neurological: Negative for weakness and numbness.  All other systems reviewed and are negative.    Physical Exam Updated Vital Signs BP 134/88   Pulse (!) 57   Temp 98.6 F (37 C) (Oral)   Resp 16   Ht 5\' 11"  (1.803 m)   Wt 90.7 kg (200 lb)   SpO2 100%   BMI 27.89 kg/m   Physical Exam  Constitutional: He appears well-developed and well-nourished. No distress.  HENT:    Head: Normocephalic and atraumatic.  Eyes: Conjunctivae are normal.  Neck: Neck supple.  Cardiovascular: Normal rate, regular rhythm and normal heart sounds.  Pulmonary/Chest: Effort normal. No respiratory distress. He has no wheezes. He has no rales.  Abdominal: Soft. Bowel sounds are normal. He exhibits no distension and no mass. There is no tenderness. There is no rebound and no guarding.  Genitourinary:  Genitourinary Comments: No swelling or tenderness of her scrotum.  No inguinal lymphadenopathy.  Musculoskeletal: He exhibits no edema.  Normal-appearing right groin.  Tenderness to palpation over anterior lateral groin, over hip flexor muscles.  Pain with active flexion of the right hip.  No pain with passive right hip flexion.  Neurological: He is alert.  Skin: Skin is warm and dry.  Nursing note and vitals reviewed.    ED Treatments / Results  Labs (all labs ordered are listed, but only abnormal results are displayed) Labs Reviewed - No data to display  EKG  EKG Interpretation None       Radiology No results found.  Procedures Procedures (including critical care time)  Medications Ordered in ED Medications - No data to display   Initial Impression / Assessment and Plan / ED Course  I have reviewed the triage vital signs and the nursing notes.  Pertinent labs & imaging results that were available during my care of the patient were reviewed by me and considered in my medical decision making (see chart for details).     Patient in the emergency department with recurrent right groin pain.  Pain is worsened with flexion of the right hip.  No injuries.  On exam, no palpable hernias present.  He is not having abdominal pain.  No nausea or vomiting.  No difficulty going to the bathroom.  No urinary symptoms.  There is no swelling or pain in the scrotum.  There is no lymphadenopathy in the groin.  His tenderness is directly over the flexor muscles.  I suspect he may be  having muscle spasms.  Will place back on the muscle relaxant, NSAIDs, will have him do heating pads, stretches, follow-up with family doctor.  Return precautions discussed.  I do not think he needs any emergent imaging or labs at this time.  Vitals:   08/09/17 1139 08/09/17 1140  BP:  134/88  Pulse:  (!) 57  Resp:  16  Temp:  98.6 F (37 C)  TempSrc:  Oral  SpO2:  100%  Weight: 90.7 kg (200 lb)   Height: 5\' 11"  (1.803 m)      Final Clinical Impressions(s) / ED Diagnoses   Final diagnoses:  Groin strain, right, initial encounter    ED Discharge Orders    None       Jaynie CrumbleKirichenko, Pocahontas Cohenour, PA-C 08/09/17 1231    Melene PlanFloyd, Dan, DO 08/09/17 1431

## 2017-08-09 NOTE — Discharge Instructions (Signed)
Heating pads to the area.  Take naproxen as prescribed for pain and inflammation.  Continue Flexeril.  Try stretching the area.  If you notice any swelling to the area, difficulty going to the bathroom, blood in your urine, fever chills, nausea or vomiting, any concerning symptoms in your leg, please return back to emergency department.

## 2017-08-09 NOTE — ED Triage Notes (Signed)
Pain in his right upper leg today. He fell due to pain. No injury. States he had the same pain 2 months ago and was seen at a [facility] facility without cause found.

## 2019-04-05 ENCOUNTER — Ambulatory Visit
Admission: EM | Admit: 2019-04-05 | Discharge: 2019-04-05 | Disposition: A | Discharge status: Home / Self Care | Payer: 59 | Attending: Physician Assistant | Admitting: Physician Assistant

## 2019-04-05 ENCOUNTER — Other Ambulatory Visit: Payer: Self-pay

## 2019-04-05 ENCOUNTER — Other Ambulatory Visit
Admission: RE | Admit: 2019-04-05 | Discharge: 2019-04-05 | Disposition: A | Discharge status: Home / Self Care | Payer: 59 | Source: Ambulatory Visit | Attending: Physician Assistant | Admitting: Physician Assistant

## 2019-04-05 DIAGNOSIS — R1013 Epigastric pain: Secondary | ICD-10-CM

## 2019-04-05 DIAGNOSIS — I10 Essential (primary) hypertension: Secondary | ICD-10-CM | POA: Diagnosis not present

## 2019-04-05 LAB — COMPREHENSIVE METABOLIC PANEL
ALT: 43 U/L (ref 0–44)
AST: 24 U/L (ref 15–41)
Albumin: 4.3 g/dL (ref 3.5–5.0)
Alkaline Phosphatase: 75 U/L (ref 38–126)
Anion gap: 16 — ABNORMAL HIGH (ref 5–15)
BUN: 18 mg/dL (ref 6–20)
CO2: 22 mmol/L (ref 22–32)
Calcium: 9.5 mg/dL (ref 8.9–10.3)
Chloride: 99 mmol/L (ref 98–111)
Creatinine, Ser: 0.95 mg/dL (ref 0.61–1.24)
GFR calc Af Amer: >60 mL/min (ref >60)
GFR calc non Af Amer: >60 mL/min (ref >60)
Glucose, Bld: 84 mg/dL (ref 70–99)
Potassium: 4.2 mmol/L (ref 3.5–5.1)
Sodium: 137 mmol/L (ref 135–145)
Total Bilirubin: 0.9 mg/dL (ref 0.3–1.2)
Total Protein: 8.4 g/dL — ABNORMAL HIGH (ref 6.5–8.1)

## 2019-04-05 LAB — LIPASE, BLOOD: Lipase: 26 U/L (ref 11–51)

## 2019-04-05 MED ORDER — DICYCLOMINE HCL 20 MG PO TABS: 20.0000 mg | ORAL_TABLET | Freq: Two times a day (BID) | ORAL | 0 refills | Status: DC

## 2019-04-05 NOTE — ED Triage Notes (Signed)
Per pt on Wednesday night he started having abdominal pain with a fever. Went away the next day and has not had fevers since but still hurting in his umbilical area. Pt says feels like someone is twisting out a rag in his stomach. No nausea, no diarrhea,. No fevers now.Pain does not radiate.

## 2019-04-05 NOTE — ED Provider Notes (Signed)
Gregory Willis    CSN: [id number] Arrival date & time: 04/05/19  1445     History   Chief Complaint Chief Complaint  Patient presents with  . Abdominal Pain    HPI Gregory Willis is a 38 y.o. male.   38 year old male comes in for 3 day history of abdominal pain. States 3 days ago, had severe periumbilical pain that was cramping in sensation, causing trouble walking. He had a fever of 102.7. States fever resolved after tylenol and has not had one since then. Abdominal pain slightly eased off and has been improving since. However, he has not tried to eat since then due to the continued pain. He denies nausea/vomiting. Denies diarrhea/constipation. Had bowel movement 3 days ago that was normal for him. States abdominal pain now is intermittent, worse with movement/eating, better with laying down. Denies radiation of pain. Denies URI symptoms such as cough, congestion, sore throat. Denies chills, body aches. Denies heavy lifting. Denies history of abdominal surgeries. Denies changes in medications. Denies drug use. Denies excessive alcohol use, chronic NSAID use.      Past Medical History:  Diagnosis Date  . Hypertension     Patient Active Problem List   Diagnosis Date Noted  . Hypertension 09/06/2012  . Gynecomastia 09/06/2012  . Preventive measure 09/06/2012  . History of anaphylaxis 09/06/2012    No past surgical history on file.     Home Medications    Prior to Admission medications   Medication Sig Start Date End Date Taking? Authorizing Provider  acetaminophen (TYLENOL) 500 MG tablet Take 2 tablets (1,000 mg total) by mouth every 6 (six) hours as needed for mild pain or headache. 05/17/17   Forrest Moron, MD  cyclobenzaprine (FLEXERIL) 10 MG tablet Take 1 tablet (10 mg total) by mouth 2 (two) times daily as needed for muscle spasms. 08/09/17   Kirichenko, Tatyana, PA-C  dicyclomine (BENTYL) 20 MG tablet Take 1 tablet (20 mg total) by mouth 2 (two) times  daily. 04/05/19   Tasia Catchings, Amy V, PA-C  EPINEPHrine (EPIPEN) 0.3 mg/0.3 mL DEVI Inject 0.3 mLs (0.3 mg total) into the skin once. 09/06/12   Silverio Decamp, MD  lisinopril-hydrochlorothiazide (PRINZIDE,ZESTORETIC) 20-25 MG per tablet TAKE ONE-HALF TABLET BY MOUTH ONCE DAILY 08/19/14   Silverio Decamp, MD  naproxen (NAPROSYN) 500 MG tablet Take 1 tablet (500 mg total) by mouth 2 (two) times daily. 08/09/17   Kirichenko, Tatyana, PA-C  ondansetron (ZOFRAN) 4 MG tablet Take 1 tablet (4 mg total) by mouth every 8 (eight) hours as needed for nausea or vomiting. Patient not taking: Reported on 05/17/2017 03/27/17   Robinson, [place] N, PA-C    Family History Family History  Problem Relation Age of Onset  . Cancer Mother   . Heart disease Mother   . Hypertension Mother   . Hyperlipidemia Mother   . Stroke Mother   . Diabetes Mother   . Diabetes Maternal Aunt   . Hyperlipidemia Maternal Aunt   . Hypertension Maternal Aunt   . Diabetes Maternal Uncle   . Diabetes Maternal Grandmother   . Hypertension Maternal Grandmother   . Stroke Maternal Grandmother   . Diabetes Maternal Grandfather   . Hypertension Maternal Grandfather   . Stroke Maternal Grandfather     Social History Social History   Tobacco Use  . Smoking status: Never Smoker  . Smokeless tobacco: Never Used  Substance Use Topics  . Alcohol use: Yes    Comment: socially  .  Drug use: No     Allergies   Patient has no known allergies.   Review of Systems Review of Systems  Reason unable to perform ROS: See HPI as above.     Physical Exam Triage Vital Signs ED Triage Vitals  Enc Vitals Group     BP 04/05/19 1451 (!) 144/102     Pulse Rate 04/05/19 1451 87     Resp 04/05/19 1451 16     Temp 04/05/19 1451 97.9 F (36.6 C)     Temp Source 04/05/19 1451 Oral     SpO2 04/05/19 1451 98 %     Weight --      Height --      Head Circumference --      Peak Flow --      Pain Score 04/05/19 1453 6     Pain Loc  --      Pain Edu? --      Excl. in GC? --    No data found.  Updated Vital Signs BP (!) 144/102 (BP Location: Left Arm)   Pulse 87   Temp 97.9 F (36.6 C) (Oral)   Resp 16   SpO2 98%   Physical Exam Constitutional:      General: He is not in acute distress.    Appearance: He is well-developed.  HENT:     Head: Normocephalic and atraumatic.  Cardiovascular:     Rate and Rhythm: Normal rate and regular rhythm.     Heart sounds: Normal heart sounds. No murmur. No friction rub. No gallop.   Pulmonary:     Effort: Pulmonary effort is normal.     Breath sounds: Normal breath sounds. No wheezing or rales.  Abdominal:     General: Bowel sounds are normal.     Palpations: Abdomen is soft.     Tenderness: There is abdominal tenderness in the epigastric area. There is no right CVA tenderness, left CVA tenderness, guarding or rebound.  Skin:    General: Skin is warm and dry.  Neurological:     Mental Status: He is alert and oriented to person, place, and time.  Psychiatric:        Behavior: Behavior normal.        Judgment: Judgment normal.    UC Treatments / Results  Labs (all labs ordered are listed, but only abnormal results are displayed) Labs Reviewed  COMPREHENSIVE METABOLIC PANEL  LIPASE    EKG   Radiology No results found.  Procedures Procedures (including critical Willis time)  Medications Ordered in UC Medications - No data to display  Initial Impression / Assessment and Plan / UC Course  I have reviewed the triage vital signs and the nursing notes.  Pertinent labs & imaging results that were available during my Willis of the patient were reviewed by me and considered in my medical decision making (see chart for details).    No alarming signs on exam at this time. Patient afebrile without antipyretics. Epigastric tenderness without guarding or rebound. +BS. No abdominal surgeries in the past. Will draw CMP/lipase for further evaluation and discussed  symptomatic treatment. Strict return precautions given. Patient expresses understanding and agrees to plan.  Final Clinical Impressions(s) / UC Diagnoses   Final diagnoses:  Abdominal pain, epigastric    ED Prescriptions    Medication Sig Dispense Auth. Provider   dicyclomine (BENTYL) 20 MG tablet Take 1 tablet (20 mg total) by mouth 2 (two) times daily. 20 tablet Belinda FisherYu, Amy V, PA-C  Belinda FisherYu, Amy V, PA-C 04/05/19 [phone]

## 2019-04-05 NOTE — Discharge Instructions (Signed)
Labs drawn. Start bentyl for abdominal cramping. Continue to stay hydrated, urine should be clear to pale yellow in color. Bland diet, advance as tolerated. Follow up with PCP if symptoms not improving, but not worsening. Go to the ED if severe abdominal pain, causing trouble walking/jumping, vomiting, return of fever.

## 2019-04-06 LAB — COMPREHENSIVE METABOLIC PANEL
ALT: 43 U/L (ref 0–44)
AST: 24 U/L (ref 15–41)
Albumin: 4.3 g/dL (ref 3.5–5.0)
Alkaline Phosphatase: 75 U/L (ref 38–126)
BUN: 18 mg/dL (ref 6–20)
Bilirubin Total: 0.9 mg/dL (ref 0.3–1.2)
CO2: 22 mmol/L (ref 22–32)
Calcium: 9.5 mg/dL (ref 8.9–10.3)
Chloride: 99 mmol/L (ref 98–111)
Creatinine, Ser: 0.95 mg/dL (ref 0.61–1.24)
GFR calc Af Amer: >50 mL/min — ABNORMAL LOW (ref >60)
GFR calc non Af Amer: >60 mL/min (ref >60)
Glucose: 84 mg/dL (ref 70–99)
Potassium: 4.2 mmol/L (ref 3.5–5.1)
Sodium: 137 mmol/L (ref 135–145)
Total Protein: 8.4 g/dL — ABNORMAL HIGH (ref 6.5–8.1)

## 2019-04-06 LAB — LIPASE: Lipase: 26 U/L (ref 11–51)

## 2019-04-07 ENCOUNTER — Telehealth (HOSPITAL_COMMUNITY): Payer: Self-pay | Admitting: Emergency Medicine

## 2019-04-07 ENCOUNTER — Telehealth: Payer: Self-pay | Admitting: Sports Medicine

## 2019-04-07 NOTE — Telephone Encounter (Signed)
Labs unremarkable, pt contacted and made aware, is waiting for notification from his PCP for follow up. Pt encouraged to return to be reseen if unable to see PCP and having continued symptoms. All questions answered.

## 2019-04-07 NOTE — Telephone Encounter (Signed)
Okay to schedule him as a new physical.

## 2019-04-07 NOTE — Telephone Encounter (Signed)
Patient states he ran into you at the [place] at [place] and wanted to re-establish with you. States that you told him it would be okay and to do a physical. Please advise.

## 2019-04-08 NOTE — Telephone Encounter (Signed)
Appointment has been made. No further questions at this time.  

## 2019-04-22 ENCOUNTER — Encounter: Payer: Self-pay | Admitting: Sports Medicine

## 2019-04-22 ENCOUNTER — Ambulatory Visit (INDEPENDENT_AMBULATORY_CARE_PROVIDER_SITE_OTHER): Payer: 59 | Admitting: Sports Medicine

## 2019-04-22 ENCOUNTER — Other Ambulatory Visit: Payer: Self-pay

## 2019-04-22 ENCOUNTER — Ambulatory Visit (INDEPENDENT_AMBULATORY_CARE_PROVIDER_SITE_OTHER): Payer: 59

## 2019-04-22 DIAGNOSIS — M25551 Pain in right hip: Secondary | ICD-10-CM

## 2019-04-22 DIAGNOSIS — Z299 Encounter for prophylactic measures, unspecified: Secondary | ICD-10-CM

## 2019-04-22 DIAGNOSIS — I10 Essential (primary) hypertension: Secondary | ICD-10-CM

## 2019-04-22 MED ORDER — CELECOXIB 200 MG PO CAPS
ORAL_CAPSULE | ORAL | 2 refills | Status: DC
Start: 2019-04-22 — End: 2020-08-24

## 2019-04-22 MED ORDER — EPINEPHRINE 0.3 MG/0.3ML IJ SOAJ
0.3000 mg | INTRAMUSCULAR | 11 refills | Status: AC | PRN
Start: 2019-04-22 — End: ?

## 2019-04-22 MED ORDER — LISINOPRIL-HYDROCHLOROTHIAZIDE 10-12.5 MG PO TABS
1.0000 | ORAL_TABLET | Freq: Every day | ORAL | 1 refills | Status: DC
Start: 2019-04-22 — End: 2020-04-24
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
## 2019-04-22 NOTE — Assessment & Plan Note (Signed)
I suspect he has a right hip labral tear. Adding x-rays today, hip rehabilitation exercises, there is no evidence of a hip flexor strain. Celebrex. If no better in a month we will proceed with MR arthrogram of the right hip.

## 2019-04-22 NOTE — Assessment & Plan Note (Signed)
Refilling blood pressure medication, well controlled.

## 2019-04-22 NOTE — Addendum Note (Signed)
Addended by: Silverio Decamp on: 04/22/2019 12:02 PM   Modules accepted: Orders

## 2019-04-23 LAB — COMPLETE METABOLIC PANEL WITH GFR
AG Ratio: 1.4 (calc) (ref 1.0–2.5)
ALT: 23 U/L (ref 9–46)
AST: 18 U/L (ref 10–40)
Albumin: 4.6 g/dL (ref 3.6–5.1)
Alkaline phosphatase (APISO): 65 U/L (ref 36–130)
BUN: 15 mg/dL (ref 7–25)
CO2: 30 mmol/L (ref 20–32)
Calcium: 10.3 mg/dL (ref 8.6–10.3)
Chloride: 101 mmol/L (ref 98–110)
Creat: 1.07 mg/dL (ref 0.60–1.35)
GFR, Est African American: 101 mL/min/{1.73_m2} (ref >=60)
GFR, Est Non African American: 87 mL/min/{1.73_m2} (ref >=60)
Globulin: 3.2 g/dL (calc) (ref 1.9–3.7)
Glucose, Bld: 94 mg/dL (ref 65–99)
Potassium: 4.7 mmol/L (ref 3.5–5.3)
Sodium: 139 mmol/L (ref 135–146)
Total Bilirubin: 0.6 mg/dL (ref 0.2–1.2)
Total Protein: 7.8 g/dL (ref 6.1–8.1)

## 2019-04-23 LAB — CBC
HCT: 42.9 % (ref 38.5–50.0)
Hemoglobin: 13.9 g/dL (ref 13.2–17.1)
MCH: 25.9 pg — ABNORMAL LOW (ref 27.0–33.0)
MCHC: 32.4 g/dL (ref 32.0–36.0)
MCV: 80 fL (ref 80.0–100.0)
MPV: 9.8 fL (ref 7.5–12.5)
Platelets: 302 10*3/uL (ref 140–400)
RBC: 5.36 10*6/uL (ref 4.20–5.80)
RDW: 13.2 % (ref 11.0–15.0)
WBC: 4.7 10*3/uL (ref 3.8–10.8)

## 2019-04-23 LAB — TSH: TSH: 0.61 mIU/L (ref 0.40–4.50)

## 2019-04-23 LAB — LIPID PANEL W/REFLEX DIRECT LDL
Cholesterol: 163 mg/dL (ref <200)
HDL: 46 mg/dL (ref >=40)
LDL Cholesterol (Calc): 100 mg/dL (calc) — ABNORMAL HIGH
Non-HDL Cholesterol (Calc): 117 mg/dL (calc) (ref <130)
Total CHOL/HDL Ratio: 3.5 (calc) (ref <5.0)
Triglycerides: 77 mg/dL (ref <150)

## 2019-04-23 LAB — HEMOGLOBIN A1C
Hgb A1c MFr Bld: 5.8 % of total Hgb — ABNORMAL HIGH (ref <5.7)
Mean Plasma Glucose: 120 (calc)
eAG (mmol/L): 6.6 (calc)

## 2019-04-23 LAB — HIV ANTIBODY (ROUTINE TESTING W REFLEX): HIV 1&2 Ab, 4th Generation: NONREACTIVE

## 2019-04-23 LAB — VITAMIN D 25 HYDROXY (VIT D DEFICIENCY, FRACTURES): Vit D, 25-Hydroxy: 17 ng/mL — ABNORMAL LOW (ref 30–100)

## 2019-04-23 MED ORDER — VITAMIN D (ERGOCALCIFEROL) 1.25 MG (50000 UNIT) PO CAPS: 50000.0000 [IU] | ORAL_CAPSULE | ORAL | 0 refills | Status: DC

## 2019-04-23 NOTE — Addendum Note (Signed)
Addended by: Silverio Decamp on: 04/23/2019 09:29 AM   Modules accepted: Orders

## 2019-07-22 ENCOUNTER — Encounter: Payer: Self-pay | Admitting: Sports Medicine

## 2019-07-23 NOTE — Telephone Encounter (Signed)
Appointment has been made. No further questions at this time.  

## 2019-07-28 ENCOUNTER — Other Ambulatory Visit: Payer: Self-pay

## 2019-07-28 ENCOUNTER — Ambulatory Visit: Payer: 59

## 2019-07-28 ENCOUNTER — Ambulatory Visit (INDEPENDENT_AMBULATORY_CARE_PROVIDER_SITE_OTHER): Payer: 59 | Admitting: Osteopathic Medicine

## 2019-07-28 DIAGNOSIS — Z23 Encounter for immunization: Secondary | ICD-10-CM | POA: Diagnosis not present

## 2020-01-16 ENCOUNTER — Ambulatory Visit (INDEPENDENT_AMBULATORY_CARE_PROVIDER_SITE_OTHER): Payer: 59 | Admitting: Sports Medicine

## 2020-01-16 ENCOUNTER — Other Ambulatory Visit: Payer: Self-pay

## 2020-01-16 DIAGNOSIS — M25551 Pain in right hip: Secondary | ICD-10-CM | POA: Diagnosis not present

## 2020-01-16 MED ORDER — TRAMADOL HCL 50 MG PO TABS: 50.0000 mg | ORAL_TABLET | Freq: Three times a day (TID) | ORAL | 0 refills | Status: DC | PRN

## 2020-01-16 NOTE — Progress Notes (Signed)
    Procedures performed today:    None.  Independent interpretation of notes and tests performed by another provider:   None.  Brief History, Exam, Impression, and Recommendations:    Right hip pain This is a very pleasant 39 year old male, he returns with recurrence of right hip pain. I last saw him in August for this, symptoms were consistent with osteoarthritis/a labral tear. Celebrex seem to work, he is now having recurrence of pain, Celebrex has helped just a little bit, I am going to add tramadol for breakthrough pain. I'd like him to do this for 1 to 2 weeks and if insufficient improvement we will proceed with MR arthrogram with steroid injection. I am also going to have him do some hip rehabilitation exercises.    ___________________________________________ Ihor Austin. Benjamin Stain, M.D., ABFM., CAQSM. Primary Care and Sports Medicine [facility]  Adjunct Instructor of Family Medicine  [employer]

## 2020-01-16 NOTE — Assessment & Plan Note (Signed)
This is a very pleasant 39 year old male, he returns with recurrence of right hip pain. I last saw him in August for this, symptoms were consistent with osteoarthritis/a labral tear. Celebrex seem to work, he is now having recurrence of pain, Celebrex has helped just a little bit, I am going to add tramadol for breakthrough pain. I'd like him to do this for 1 to 2 weeks and if insufficient improvement we will proceed with MR arthrogram with steroid injection. I am also going to have him do some hip rehabilitation exercises.

## 2020-01-20 ENCOUNTER — Ambulatory Visit: Payer: 59 | Admitting: Sports Medicine

## 2020-01-30 ENCOUNTER — Ambulatory Visit: Payer: 59 | Admitting: Sports Medicine

## 2020-04-24 ENCOUNTER — Other Ambulatory Visit: Payer: Self-pay | Admitting: Sports Medicine

## 2020-04-24 DIAGNOSIS — I10 Essential (primary) hypertension: Secondary | ICD-10-CM

## 2020-05-06 ENCOUNTER — Encounter: Payer: 59 | Admitting: Sports Medicine

## 2020-05-19 ENCOUNTER — Encounter: Payer: 59 | Admitting: Sports Medicine

## 2020-07-26 ENCOUNTER — Ambulatory Visit (INDEPENDENT_AMBULATORY_CARE_PROVIDER_SITE_OTHER): Payer: 59 | Admitting: Sports Medicine

## 2020-07-26 ENCOUNTER — Other Ambulatory Visit: Payer: Self-pay

## 2020-07-26 VITALS — BP 166/95 | HR 61 | Ht 71.0 in | Wt 225.0 lb

## 2020-07-26 DIAGNOSIS — Z113 Encounter for screening for infections with a predominantly sexual mode of transmission: Secondary | ICD-10-CM | POA: Insufficient documentation

## 2020-07-26 DIAGNOSIS — Z23 Encounter for immunization: Secondary | ICD-10-CM | POA: Diagnosis not present

## 2020-07-26 DIAGNOSIS — I10 Essential (primary) hypertension: Secondary | ICD-10-CM

## 2020-07-26 DIAGNOSIS — Z0000 Encounter for general adult medical examination without abnormal findings: Secondary | ICD-10-CM | POA: Diagnosis not present

## 2020-07-26 DIAGNOSIS — K580 Irritable bowel syndrome with diarrhea: Secondary | ICD-10-CM

## 2020-07-26 MED ORDER — VALSARTAN-HYDROCHLOROTHIAZIDE 160-25 MG PO TABS: 1.0000 | ORAL_TABLET | Freq: Every day | ORAL | 3 refills | Status: DC

## 2020-07-26 MED ORDER — HYOSCYAMINE SULFATE ER 0.375 MG PO TB12: ORAL_TABLET | ORAL | 0 refills | Status: DC

## 2020-07-26 NOTE — Assessment & Plan Note (Signed)
Gregory Willis gets very nervous before flying, he tends to get significant GI spasm, tenesmus and need to have an explosive bowel movement. Going to add Levbid for him to take morning of flights to help block his GI spasm.

## 2020-07-26 NOTE — Assessment & Plan Note (Addendum)
Continues to be elevated even on recheck. Switching to valsartan/HCTZ 160/25

## 2020-07-26 NOTE — Assessment & Plan Note (Signed)
Adding a full STD screen per patient request.   No symptoms.

## 2020-07-26 NOTE — Patient Instructions (Signed)

## 2020-07-26 NOTE — Assessment & Plan Note (Addendum)
Annual physical exam as above. Order routine labs. He did drop off some paperwork for [place] to fill out after we get his labs.

## 2020-07-26 NOTE — Progress Notes (Signed)
Subjective:    CC: Annual Physical Exam  HPI:  This patient is here for their annual physical  I reviewed the past medical history, family history, social history, surgical history, and allergies today and no changes were needed.  Please see the problem list section below in epic for further details.  Past Medical History: Past Medical History:  Diagnosis Date  . Hypertension    Past Surgical History: No past surgical history on file. Social History: Social History   Socioeconomic History  . Marital status: Single    Spouse name: Not on file  . Number of children: Not on file  . Years of education: Not on file  . Highest education level: Not on file  Occupational History  . Not on file  Tobacco Use  . Smoking status: Never Smoker  . Smokeless tobacco: Never Used  Vaping Use  . Vaping Use: Never used  Substance and Sexual Activity  . Alcohol use: Yes    Comment: socially  . Drug use: No  . Sexual activity: Not Currently  Other Topics Concern  . Not on file  Social History Narrative  . Not on file   Social Determinants of Health   Financial Resource Strain:   . Difficulty of Paying Living Expenses: Not on file  Food Insecurity:   . Worried About Programme researcher, broadcasting/film/video in the Last Year: Not on file  . Ran Out of Food in the Last Year: Not on file  Transportation Needs:   . Lack of Transportation (Medical): Not on file  . Lack of Transportation (Non-Medical): Not on file  Physical Activity:   . Days of Exercise per Week: Not on file  . Minutes of Exercise per Session: Not on file  Stress:   . Feeling of Stress : Not on file  Social Connections:   . Frequency of Communication with Friends and Family: Not on file  . Frequency of Social Gatherings with Friends and Family: Not on file  . Attends Religious Services: Not on file  . Active Member of Clubs or Organizations: Not on file  . Attends Banker Meetings: Not on file  . Marital Status: Not on  file   Family History: Family History  Problem Relation Age of Onset  . Cancer Mother   . Heart disease Mother   . Hypertension Mother   . Hyperlipidemia Mother   . Stroke Mother   . Diabetes Mother   . Diabetes Maternal Aunt   . Hyperlipidemia Maternal Aunt   . Hypertension Maternal Aunt   . Diabetes Maternal Uncle   . Diabetes Maternal Grandmother   . Hypertension Maternal Grandmother   . Stroke Maternal Grandmother   . Diabetes Maternal Grandfather   . Hypertension Maternal Grandfather   . Stroke Maternal Grandfather    Allergies: No Known Allergies Medications: See med rec.  Review of Systems: No headache, visual changes, nausea, vomiting, diarrhea, constipation, dizziness, abdominal pain, skin rash, fevers, chills, night sweats, swollen lymph nodes, weight loss, chest pain, body aches, joint swelling, muscle aches, shortness of breath, mood changes, visual or auditory hallucinations.  Objective:    General: Well Developed, well nourished, and in no acute distress.  Neuro: Alert and oriented x3, extra-ocular muscles intact, sensation grossly intact. Cranial nerves II through XII are intact, motor, sensory, and coordinative functions are all intact. HEENT: Normocephalic, atraumatic, pupils equal round reactive to light, neck supple, no masses, no lymphadenopathy, thyroid nonpalpable. Oropharynx, nasopharynx, external ear canals are unremarkable. Skin:  Warm and dry, no rashes noted.  Cardiac: Regular rate and rhythm, no murmurs rubs or gallops.  Respiratory: Clear to auscultation bilaterally. Not using accessory muscles, speaking in full sentences.  Abdominal: Soft, nontender, nondistended, positive bowel sounds, no masses, no organomegaly.  Musculoskeletal: Shoulder, elbow, wrist, hip, knee, ankle stable, and with full range of motion.  Impression and Recommendations:    The patient was counselled, risk factors were discussed, anticipatory guidance given.  Annual  physical exam Annual physical exam as above. Order routine labs. He did drop off some paperwork for [place] to fill out after we get his labs.  Screen for STD (sexually transmitted disease) Adding a full STD screen per patient request.   No symptoms.  Irritable bowel syndrome with diarrhea Gregory Willis gets very nervous before flying, he tends to get significant GI spasm, tenesmus and need to have an explosive bowel movement. Going to add Levbid for him to take morning of flights to help block his GI spasm.   Hypertension Continues to be elevated even on recheck. Switching to valsartan/HCTZ 160/25   ___________________________________________ Ihor Austin. Benjamin Stain, M.D., ABFM., CAQSM. Primary Care and Sports Medicine [facility]  Adjunct Professor of Family Medicine  [employer]

## 2020-07-27 LAB — CBC
HCT: 43.5 % (ref 38.5–50.0)
Hemoglobin: 14.7 g/dL (ref 13.2–17.1)
MCH: 27 pg (ref 27.0–33.0)
MCHC: 33.8 g/dL (ref 32.0–36.0)
MCV: 80 fL (ref 80.0–100.0)
MPV: 9.9 fL (ref 7.5–12.5)
Platelets: 276 10*3/uL (ref 140–400)
RBC: 5.44 10*6/uL (ref 4.20–5.80)
RDW: 13.4 % (ref 11.0–15.0)
WBC: 5 10*3/uL (ref 3.8–10.8)

## 2020-07-27 LAB — COMPREHENSIVE METABOLIC PANEL
AG Ratio: 1.5 (calc) (ref 1.0–2.5)
ALT: 26 U/L (ref 9–46)
AST: 24 U/L (ref 10–40)
Albumin: 4.8 g/dL (ref 3.6–5.1)
Alkaline phosphatase (APISO): 79 U/L (ref 36–130)
BUN: 10 mg/dL (ref 7–25)
CO2: 31 mmol/L (ref 20–32)
Calcium: 10 mg/dL (ref 8.6–10.3)
Chloride: 98 mmol/L (ref 98–110)
Creat: 0.84 mg/dL (ref 0.60–1.35)
Globulin: 3.2 g/dL (calc) (ref 1.9–3.7)
Glucose, Bld: 88 mg/dL (ref 65–99)
Potassium: 3.7 mmol/L (ref 3.5–5.3)
Sodium: 137 mmol/L (ref 135–146)
Total Bilirubin: 0.7 mg/dL (ref 0.2–1.2)
Total Protein: 8 g/dL (ref 6.1–8.1)

## 2020-07-27 LAB — LIPID PANEL
Cholesterol: 214 mg/dL — ABNORMAL HIGH (ref <200)
HDL: 78 mg/dL (ref >=40)
LDL Cholesterol (Calc): 113 mg/dL (calc) — ABNORMAL HIGH
Non-HDL Cholesterol (Calc): 136 mg/dL (calc) — ABNORMAL HIGH (ref <130)
Total CHOL/HDL Ratio: 2.7 (calc) (ref <5.0)
Triglycerides: 123 mg/dL (ref <150)

## 2020-07-27 LAB — HEMOGLOBIN A1C
Hgb A1c MFr Bld: 5.8 % of total Hgb — ABNORMAL HIGH (ref <5.7)
Mean Plasma Glucose: 120 (calc)
eAG (mmol/L): 6.6 (calc)

## 2020-07-27 LAB — TSH: TSH: 1.97 mIU/L (ref 0.40–4.50)

## 2020-07-27 LAB — C. TRACHOMATIS/N. GONORRHOEAE RNA
C. trachomatis RNA, TMA: NOT DETECTED
N. gonorrhoeae RNA, TMA: NOT DETECTED

## 2020-07-27 LAB — RPR: RPR Ser Ql: NONREACTIVE

## 2020-07-27 LAB — HIV ANTIBODY (ROUTINE TESTING W REFLEX): HIV 1&2 Ab, 4th Generation: NONREACTIVE

## 2020-07-27 LAB — HEPATITIS PANEL, ACUTE
Hep A IgM: NONREACTIVE
Hep B C IgM: NONREACTIVE
Hepatitis B Surface Ag: NONREACTIVE
Hepatitis C Ab: NONREACTIVE
SIGNAL TO CUT-OFF: 0.12 (ref <1.00)

## 2020-08-10 ENCOUNTER — Other Ambulatory Visit: Payer: Self-pay

## 2020-08-10 ENCOUNTER — Ambulatory Visit (INDEPENDENT_AMBULATORY_CARE_PROVIDER_SITE_OTHER): Payer: 59 | Admitting: Sports Medicine

## 2020-08-10 VITALS — BP 118/72 | HR 72 | Ht 71.0 in | Wt 222.0 lb

## 2020-08-10 DIAGNOSIS — I10 Essential (primary) hypertension: Secondary | ICD-10-CM

## 2020-08-10 NOTE — Progress Notes (Signed)
   Subjective:    Patient ID: Gregory Willis, male    DOB: Feb 24, 1981, 39 y.o.   MRN: [id number]  HPI Patient is here for blood pressure check. Medication was changed at last OV to valsartan-hctz 160/25 mg. Denies trouble sleeping, palpitations, or medication problems. Has been taking medication daily as prescribed.    Review of Systems     Objective:   Physical Exam        Assessment & Plan:  Patient's blood pressure was in range today at 118/72. Patient doing well on new medication, no complaints.

## 2020-08-24 ENCOUNTER — Encounter: Payer: Self-pay | Admitting: Physician Assistant

## 2020-08-24 ENCOUNTER — Other Ambulatory Visit: Payer: Self-pay

## 2020-08-24 ENCOUNTER — Ambulatory Visit (INDEPENDENT_AMBULATORY_CARE_PROVIDER_SITE_OTHER): Payer: 59 | Admitting: Physician Assistant

## 2020-08-24 ENCOUNTER — Ambulatory Visit (INDEPENDENT_AMBULATORY_CARE_PROVIDER_SITE_OTHER): Payer: 59

## 2020-08-24 VITALS — BP 130/74 | HR 72 | Ht 71.0 in | Wt 222.0 lb

## 2020-08-24 DIAGNOSIS — N644 Mastodynia: Secondary | ICD-10-CM | POA: Diagnosis not present

## 2020-08-24 DIAGNOSIS — K625 Hemorrhage of anus and rectum: Secondary | ICD-10-CM | POA: Diagnosis not present

## 2020-08-24 DIAGNOSIS — M940 Chondrocostal junction syndrome [Tietze]: Secondary | ICD-10-CM | POA: Insufficient documentation

## 2020-08-24 DIAGNOSIS — R079 Chest pain, unspecified: Secondary | ICD-10-CM | POA: Diagnosis not present

## 2020-08-24 MED ORDER — CELECOXIB 200 MG PO CAPS: ORAL_CAPSULE | ORAL | 2 refills | Status: DC

## 2020-08-24 MED ORDER — DICLOFENAC SODIUM 1 % EX GEL: 4.0000 g | Freq: Four times a day (QID) | CUTANEOUS | 0 refills | Status: DC

## 2020-08-24 NOTE — Progress Notes (Signed)
Subjective:    Patient ID: Gregory Willis, male    DOB: 08-Apr-1981, 39 y.o.   MRN: [id number]  HPI  Pt is a 39 yo male with IBS-D, HTN, gynecomastia who presents to the clinic to discuss one episode of blood in stool this weekend. Blood was bright red and only occurred once. He does admit to drinking a lot of alcohol. Blood has resolved. No abdominal pain. Stools return to "normal for him". No family hx of colon cancer.   Pt is concerned with some right sided breast tenderness that has radiated into right upper chest tenderness. Normal mammogram 2015. A little worse with deep breathing and right arm movement. Pain started 1 and 1/2 weeks ago. He has been doing a lot of heavy lifting. Not tried anything to make better.   .. Active Ambulatory Problems    Diagnosis Date Noted   Hypertension 09/06/2012   Gynecomastia 09/06/2012   Annual physical exam 09/06/2012   History of anaphylaxis 09/06/2012   Right hip pain 04/22/2019   Screen for STD (sexually transmitted disease) 07/26/2020   Irritable bowel syndrome with diarrhea 07/26/2020   Breast pain, right 08/24/2020   Right-sided chest pain 08/24/2020   Resolved Ambulatory Problems    Diagnosis Date Noted   No Resolved Ambulatory Problems   No Additional Past Medical History       Review of Systems See HPI.     Objective:   Physical Exam Vitals reviewed.  Constitutional:      Appearance: He is well-developed. He is obese.  HENT:     Head: Normocephalic and atraumatic.  Eyes:     Pupils: Pupils are equal, round, and reactive to light.  Cardiovascular:     Rate and Rhythm: Normal rate and regular rhythm.     Heart sounds: Normal heart sounds.  Pulmonary:     Effort: Pulmonary effort is normal.     Breath sounds: Normal breath sounds.     Comments: Diffuse tenderness to palpation over right breast tissues. No masses. Enlarged breast tissue.  No nipple discharge or retraction. Chest:     Chest wall: Tenderness  present. No crepitus.  Neurological:     General: No focal deficit present.     Mental Status: He is alert.  Psychiatric:        Mood and Affect: Mood normal.    Declined hemoccult.        Assessment & Plan:  Marland KitchenMarland KitchenAntwane was seen today for chest pain.  Diagnoses and all orders for this visit:  Bright red blood per rectum  Right-sided chest pain -     DG Chest 2 View -     diclofenac Sodium (VOLTAREN) 1 % GEL; Apply 4 g topically 4 (four) times daily. To affected joint. -     celecoxib (CELEBREX) 200 MG capsule; One to 2 tablets by mouth daily as needed for pain.  Breast pain, right -     MM DIAG BREAST TOMO BILATERAL; Future -     US BREAST LTD UNI RIGHT INC AXILLA; Future -     celecoxib (CELEBREX) 200 MG capsule; One to 2 tablets by mouth daily as needed for pain.  Breast tenderness in male -     MM DIAG BREAST TOMO BILATERAL; Future -     US BREAST LTD UNI RIGHT INC AXILLA; Future -     celecoxib (CELEBREX) 200 MG capsule; One to 2 tablets by mouth daily as needed for pain.   Bright red  blood likely hemorrhoid. Declined hemoccult. A little early for colonoscopy. If occurs again suggest colonoscopy. Discussed hemorrhoid treatment and prevention.   Mammogram ordered for breast tenderness evaluation. Chest pain/tightness appears musculoskeletal and likely from heavy lifting. CXR normal. He has used Celebrex before for pain. Ok to use now. Ice area. Voltaren gel to use as needed. Encouraged pectoralis stretching. Follow up if symptoms worsen or change.

## 2020-08-25 NOTE — Progress Notes (Signed)
Gregory Willis,   No abnormal findings on chest xray.

## 2020-08-30 ENCOUNTER — Telehealth: Payer: Self-pay | Admitting: Physician Assistant

## 2020-08-30 ENCOUNTER — Encounter: Payer: Self-pay | Admitting: Physician Assistant

## 2020-08-30 NOTE — Telephone Encounter (Signed)
Received fax for PA on Diclofenac Sodium Sent through cover my meds waiting on determination. - CF

## 2020-09-15 ENCOUNTER — Telehealth: Payer: Self-pay

## 2020-09-15 NOTE — Telephone Encounter (Signed)
No clue.  I'd have to see it for myself.

## 2020-09-15 NOTE — Telephone Encounter (Signed)
Did he not want to get mammogram ordered?  Did he try the celebrex?  Did he use the voltaren gel?  Is it worsening? He may need another evaluation with [facility].

## 2020-09-15 NOTE — Telephone Encounter (Signed)
Patient called to report that he is still having chest discomfort. He saw Jade on 102/14/21 for this. He wants to know what your think. Please advise.

## 2020-09-15 NOTE — Telephone Encounter (Signed)
Please see patient message below. Can you see if you can address since you saw patient for this originally? I just want to help the patient.

## 2020-09-16 ENCOUNTER — Ambulatory Visit (INDEPENDENT_AMBULATORY_CARE_PROVIDER_SITE_OTHER): Payer: 59 | Admitting: Sports Medicine

## 2020-09-16 ENCOUNTER — Other Ambulatory Visit: Payer: Self-pay

## 2020-09-16 DIAGNOSIS — N62 Hypertrophy of breast: Secondary | ICD-10-CM | POA: Diagnosis not present

## 2020-09-16 DIAGNOSIS — M940 Chondrocostal junction syndrome [Tietze]: Secondary | ICD-10-CM

## 2020-09-16 MED ORDER — TAMOXIFEN CITRATE 20 MG PO TABS: 20.0000 mg | ORAL_TABLET | Freq: Every day | ORAL | 1 refills | Status: DC

## 2020-09-16 MED ORDER — PREDNISONE 50 MG PO TABS: ORAL_TABLET | ORAL | 0 refills | Status: DC

## 2020-09-16 NOTE — Telephone Encounter (Signed)
Patient scheduled toi see Dr. Karie Schwalbe today.

## 2020-09-16 NOTE — Assessment & Plan Note (Addendum)
We have discussed weight loss strategies and plastic surgery, I do not think his pain is coming from his gynecomastia, it is costochondritis as above. I am going to add tamoxifen to help shrink his breast tissue. Anastrozole/other aromatase inhibitors may be an option but clinical studies have not shown significant improvement in gynecomastia as compared to tamoxifen. We will try 56-month course of tamoxifen.  Benign pseudogynecomastia indicating that the fullness is not due to breast tissue but more just adipose tissue, aggressive weight loss and pectoralis muscle strengthening will help this.

## 2020-09-16 NOTE — Assessment & Plan Note (Signed)
Acute right-sided chest wall pain for a month or so now. He does have some gynecomastia on exam however the majority of his discomfort is localized directly over the right costal cartilage, somewhere between the fourth and sixth ribs. Worse with deep breathing. We will add a 5-day burst of prednisone. If he does not respond to this after several weeks we will consider injections around each of his painful costochondral junctions.

## 2020-09-16 NOTE — Telephone Encounter (Signed)
Received fax from [facility] and medication was denied placing in providers box for review. - CF

## 2020-09-16 NOTE — Patient Instructions (Signed)
Costochondritis  Costochondritis is swelling and irritation (inflammation) of the tissue (cartilage) that connects your ribs to your breastbone (sternum). This causes pain in the front of your chest. The pain usually starts gradually and involves more than one rib. What are the causes? The exact cause of this condition is not always known. It results from stress on the cartilage where your ribs attach to your sternum. The cause of this stress could be:  Chest injury (trauma).  Exercise or activity, such as lifting.  Severe coughing. What increases the risk? You may be at higher risk for this condition if you:  Are male.  Are 30?40 years old.  Recently started a new exercise or work activity.  Have low levels of vitamin D.  Have a condition that makes you cough frequently. What are the signs or symptoms? The main symptom of this condition is chest pain. The pain:  Usually starts gradually and can be sharp or dull.  Gets worse with deep breathing, coughing, or exercise.  Gets better with rest.  May be worse when you press on the sternum-rib connection (tenderness). How is this diagnosed? This condition is diagnosed based on your symptoms, medical history, and a physical exam. Your health care provider will check for tenderness when pressing on your sternum. This is the most important finding. You may also have tests to rule out other causes of chest pain. These may include:  A chest X-ray to check for lung problems.  An electrocardiogram (ECG) to see if you have a heart problem that could be causing the pain.  An imaging scan to rule out a chest or rib fracture. How is this treated? This condition usually goes away on its own over time. Your health care provider may prescribe an NSAID to reduce pain and inflammation. Your health care provider may also suggest that you:  Rest and avoid activities that make pain worse.  Apply heat or cold to the area to reduce pain and  inflammation.  Do exercises to stretch your chest muscles. If these treatments do not help, your health care provider may inject a numbing medicine at the sternum-rib connection to help relieve the pain. Follow these instructions at home:  Avoid activities that make pain worse. This includes any activities that use chest, abdominal, and side muscles.  If directed, put ice on the painful area: ? Put ice in a plastic bag. ? Place a towel between your skin and the bag. ? Leave the ice on for 20 minutes, 2-3 times a day.  If directed, apply heat to the affected area as often as told by your health care provider. Use the heat source that your health care provider recommends, such as a moist heat pack or a heating pad. ? Place a towel between your skin and the heat source. ? Leave the heat on for 20-30 minutes. ? Remove the heat if your skin turns bright red. This is especially important if you are unable to feel pain, heat, or cold. You may have a greater risk of getting burned.  Take over-the-counter and prescription medicines only as told by your health care provider.  Return to your normal activities as told by your health care provider. Ask your health care provider what activities are safe for you.  Keep all follow-up visits as told by your health care provider. This is important. Contact a health care provider if:  You have chills or a fever.  Your pain does not go away or it gets   worse.  You have a cough that does not go away (is persistent). Get help right away if:  You have shortness of breath. This information is not intended to replace advice given to you by your health care provider. Make sure you discuss any questions you have with your health care provider. Document Revised: 09/12/2017 Document Reviewed: 12/22/2015 Elsevier Patient Education  2020 Elsevier Inc.    Gynecomastia, Adult Gynecomastia is an overgrowth of gland tissue in a man's breasts. This may cause one  or both breasts to become enlarged. The condition often develops in men who have an imbalance of the male sex hormone (testosterone) and the male sex hormone (estrogen). This means that a man may have too much estrogen, too little testosterone, or both. Gynecomastia may be a normal part of aging for some men. It can also happen to adolescent boys during puberty. What are the causes? This condition may be caused by:  Certain medicines, such as: ? Estrogen supplements and medicines that act like estrogen in the body. ? Medicines that keep testosterone from functioning normally in the body (testosterone-inhibiting drugs). ? Anabolic steroids. ? Medicines to treat heartburn, cancer, heart disease, mental health problems, HIV, or AIDS. ? Antibiotic medicine. ? Chemotherapy medicine.  Recreational drugs, including alcohol, marijuana, and opioids.  Herbal products, including lavender and tea tree oil.  A gene that is passed from parent to child (inherited).  Certain medical conditions, such as: ? Tumors in the pituitary or adrenal gland. ? An overactive thyroid gland. ? Certain inherited disorders, including a genetic disease that causes low testosterone in males (Klinefelter syndrome). ? Cancer of the lung, kidney, liver, testicle, or gastrointestinal tract. ? Conditions that cause liver or kidney failure. ? Poor nutrition and starvation. ? Testicle shrinking or failure (testicularatrophy). In some cases, the cause may not be known. What increases the risk? The following factors may make you more likely to develop this condition:  Being 51 years old or older.  Being overweight.  Abusing alcohol or other drugs.  Having a family history of gynecomastia. What are the signs or symptoms? In most cases, breast enlargement is the only symptom. The enlargement may start near the nipple, and the breast tissue may feel firm and rubbery. Other symptoms may include:  Pain or tenderness in the  breasts.  Itchy breasts. How is this diagnosed? This condition may be diagnosed based on:  Your symptoms and medical history.  A physical exam.  Imaging tests, such as: ? An ultrasound. ? A mammogram. ? An MRI.  Blood tests.  Removal of a sample of breast tissue to be tested in a lab (biopsy). How is this treated? This condition may go away on its own, without treatment. If gynecomastia is caused by a medical problem or drug abuse, treatment may include:  Getting treatment for the underlying medical problem or for drug abuse.  Changing or stopping medicines.  Medicines to block the effects of estrogen.  Taking a testosterone replacement.  Surgery to remove breast tissue or any lumps in your breasts.  Breast reduction surgery. This may be an option if you have severe or painful gynecomastia. Follow these instructions at home:   Take over-the-counter and prescription medicines only as told by your health care provider.  Talk to your health care provider before taking any herbal medicines or diet supplements.  Do not abuse drugs or alcohol.  Keep all follow-up visits as told by your health care provider. This is important. Contact a health care  provider if:  Your breast tissue grows larger or gets more swollen or painful.  You have a lump in your testicle.  You have blood or discharge coming from your nipples.  Your nipple changes shape.  You develop a hard or painful lump in your breast. Summary  Gynecomastia is an overgrowth of gland tissue in a man's breasts. This may cause one or both breasts to become enlarged.  In most cases, breast enlargement is the only symptom. The enlargement may start near the nipple, and the breast tissue may feel firm and rubbery.  This condition may go away on its own, without treatment. In some cases, treatment for an underlying medical problem or for drug abuse may be needed.  Take over-the-counter and prescription medicines  only as told by your health care provider.  Do not abuse drugs or alcohol. This information is not intended to replace advice given to you by your health care provider. Make sure you discuss any questions you have with your health care provider. Document Revised: 02/20/2019 Document Reviewed: 02/20/2019 Elsevier Patient Education  2020 [employer].

## 2020-09-16 NOTE — Progress Notes (Addendum)
    Procedures performed today:    None.  Independent interpretation of notes and tests performed by another provider:   Chest x-ray reviewed and is unremarkable.  Brief History, Exam, Impression, and Recommendations:    Costochondritis Acute right-sided chest wall pain for a month or so now. He does have some gynecomastia on exam however the majority of his discomfort is localized directly over the right costal cartilage, somewhere between the fourth and sixth ribs. Worse with deep breathing. We will add a 5-day burst of prednisone. If he does not respond to this after several weeks we will consider injections around each of his painful costochondral junctions.  Pseudogynecomastia We have discussed weight loss strategies and plastic surgery, I do not think his pain is coming from his gynecomastia, it is costochondritis as above. I am going to add tamoxifen to help shrink his breast tissue. Anastrozole/other aromatase inhibitors may be an option but clinical studies have not shown significant improvement in gynecomastia as compared to tamoxifen. We will try 56-month course of tamoxifen.  Benign pseudogynecomastia indicating that the fullness is not due to breast tissue but more just adipose tissue, aggressive weight loss and pectoralis muscle strengthening will help this.    ___________________________________________ Ihor Austin. Benjamin Stain, M.D., ABFM., CAQSM. Primary Care and Sports Medicine [facility]  Adjunct Instructor of Family Medicine  [employer]

## 2020-09-22 ENCOUNTER — Other Ambulatory Visit: Payer: Self-pay

## 2020-09-22 ENCOUNTER — Ambulatory Visit
Admission: RE | Admit: 2020-09-22 | Discharge: 2020-09-22 | Disposition: A | Discharge status: Home / Self Care | Payer: 59 | Source: Ambulatory Visit | Attending: Physician Assistant | Admitting: Physician Assistant

## 2020-09-22 DIAGNOSIS — N644 Mastodynia: Secondary | ICD-10-CM

## 2020-09-24 NOTE — Progress Notes (Signed)
No worrisome findings with mammogram. Confirmed fat tissue behind nipple.

## 2020-10-19 ENCOUNTER — Other Ambulatory Visit: Payer: Self-pay

## 2020-10-19 ENCOUNTER — Ambulatory Visit (INDEPENDENT_AMBULATORY_CARE_PROVIDER_SITE_OTHER): Payer: 59 | Admitting: Sports Medicine

## 2020-10-19 ENCOUNTER — Encounter: Payer: Self-pay | Admitting: Sports Medicine

## 2020-10-19 DIAGNOSIS — N62 Hypertrophy of breast: Secondary | ICD-10-CM

## 2020-10-19 DIAGNOSIS — I10 Essential (primary) hypertension: Secondary | ICD-10-CM | POA: Diagnosis not present

## 2020-10-19 DIAGNOSIS — M940 Chondrocostal junction syndrome [Tietze]: Secondary | ICD-10-CM | POA: Diagnosis not present

## 2020-10-19 NOTE — Progress Notes (Signed)
    Procedures performed today:    None.  Independent interpretation of notes and tests performed by another provider:   None.  Brief History, Exam, Impression, and Recommendations:    Costochondritis This pleasant 40 year old male has right-sided chest wall pain, fourth through sixth costal cartilages, a 5-day burst of prednisone was partially effective, pain is still present but is not activity or life limiting, if persistent discomfort we will certainly consider pericostochondral injections with ultrasound guidance.  Pseudogynecomastia Mammogram showed mostly pseudogynecomastia, we did start some tamoxifen in the hopes of shrinking whenever breast glandular tissue he had. He is noting significant hot flashes. I told him it is okay for him to stop the tamoxifen if he does not notice any improvement in his gynecomastia over the next couple of months. Because he has mostly pseudogynecomastia he will need to work more aggressively on weight loss and pectoralis muscle strengthening.  Hypertension Now well controlled, no change in plan.    ___________________________________________ Ihor Austin. Benjamin Stain, M.D., ABFM., CAQSM. Primary Care and Sports Medicine [facility]  Adjunct Instructor of Family Medicine  [employer]

## 2020-10-19 NOTE — Assessment & Plan Note (Signed)
This pleasant 40 year old male has right-sided chest wall pain, fourth through sixth costal cartilages, a 5-day burst of prednisone was partially effective, pain is still present but is not activity or life limiting, if persistent discomfort we will certainly consider pericostochondral injections with ultrasound guidance.

## 2020-10-19 NOTE — Assessment & Plan Note (Signed)
Now well controlled, no change in plan.

## 2020-10-19 NOTE — Assessment & Plan Note (Signed)
Mammogram showed mostly pseudogynecomastia, we did start some tamoxifen in the hopes of shrinking whenever breast glandular tissue he had. He is noting significant hot flashes. I told him it is okay for him to stop the tamoxifen if he does not notice any improvement in his gynecomastia over the next couple of months. Because he has mostly pseudogynecomastia he will need to work more aggressively on weight loss and pectoralis muscle strengthening.

## 2020-11-28 ENCOUNTER — Other Ambulatory Visit: Payer: Self-pay | Admitting: Sports Medicine

## 2020-11-28 DIAGNOSIS — I10 Essential (primary) hypertension: Secondary | ICD-10-CM

## 2020-12-20 ENCOUNTER — Ambulatory Visit: Payer: 59 | Admitting: Sports Medicine

## 2021-01-25 ENCOUNTER — Ambulatory Visit: Payer: 59 | Admitting: Sports Medicine

## 2021-02-22 ENCOUNTER — Ambulatory Visit: Payer: 59 | Admitting: Sports Medicine

## 2021-04-12 ENCOUNTER — Telehealth: Payer: Self-pay

## 2021-04-12 NOTE — Telephone Encounter (Signed)
Needs an appointment with either me or one of my partners if I do not have any openings over the next few days.

## 2021-04-12 NOTE — Telephone Encounter (Signed)
Patient left msg stating the right sided chest pain is getting worse. Please advise.

## 2021-04-13 NOTE — Telephone Encounter (Signed)
Attempted to contact patient and VM was full, couldn't LVM. Will try again later. AM

## 2021-04-13 NOTE — Telephone Encounter (Signed)
Made patient appt with Gregory Willis on Friday at 8am. Patient wanted to see Dr T but I let patient know Dr T said he needed to be seen within the next few days and Dr T doesn't have anything until the 12th. AM

## 2021-04-14 NOTE — Progress Notes (Signed)
  HPI with pertinent ROS:   CC: right sided chest pain  HPI: Very pleasant 40 year old male presenting today with complaints of continued right-sided chest pain.  He was seen in January as well as February by his PCP for costochondritis.  He did do a short 5-day burst of prednisone which provided some relief.  After that he was started on Celebrex as well as topical Voltaren.  Notes the Celebrex was not helpful and he did not do the Voltaren gel very frequently.  He has not done either of these interventions in a while and is not currently doing any exercises or stretching of the chest muscles.  About 3 weeks ago, he stopped taking tamoxifen because the chest pain and discomfort had gotten worse.  His pain is mostly along the right side of the sternum around the fifth or sixth rib.  His discomfort is now interfering with his ability to do regular activities at home as well as work and often causes him pain with deep breathing.  He has been taking Tylenol as needed but this is not very helpful.  Taking valsartan-HCTZ 160-25 mg daily as prescribed.  He tolerates the medication well with no significant side effects pressure has been good since he started this medication.  Unfortunately he missed his dose this morning because he was in a hurry to get here on time.  I reviewed the past medical history, family history, social history, surgical history, and allergies today and no changes were needed.  Please see the problem list section below in epic for further details.   Physical exam:   General: Well Developed, well nourished, and in no acute distress.  Neuro: Alert and oriented x3.  HEENT: Normocephalic, atraumatic.  Skin: Warm and dry. Cardiac: Regular rate and rhythm, no murmurs rubs or gallops, no lower extremity edema.  Respiratory: Clear to auscultation bilaterally. Not using accessory muscles, speaking in full sentences. MSK: Tenderness to palpation along the right sided chest near the sternal  border.  Impression and Recommendations:    1. Costochondritis 2. Right-sided chest pain Unfortunately, I think his job is contributing to the continued costochondritis since he has required to lift up to 70-100 pounds throughout his long workday.  He did see some benefit from a short burst of prednisone so we will do a 12-day taper to see if he has better resolution.  After that switching from Celebrex to meloxicam 15 mg daily.  Okay to use Voltaren gel 4 times daily to the affected area.  If this is not helpful, consider using lidocaine patches.  Printout of stretches for costochondritis provided.  Advised avoiding heavy lifting and significant exertion of chest muscles while at the gym for the next 2 to 4 weeks.  Work note provided to limit lifting to no more than 20 pounds for the next 4 weeks. - diclofenac Sodium (VOLTAREN) 1 % GEL; Apply 4 g topically 4 (four) times daily. To affected joint.  Dispense: 100 g; Refill: 0  3. Primary hypertension Blood pressure is elevated today but he did miss his dose.  Continue valsartan-HCTZ 160-25 mg daily as prescribed. - valsartan-hydrochlorothiazide (DIOVAN-HCT) 160-25 MG tablet; Take 1 tablet by mouth daily.  Dispense: 90 tablet; Refill: 1  Return in about 4 weeks (around 05/13/2021) for Right-sided chest pain follow-up. ___________________________________________ Thayer Ohm, DNP, APRN, FNP-BC Primary Care and Sports Medicine [facility] [place]

## 2021-04-15 ENCOUNTER — Ambulatory Visit (INDEPENDENT_AMBULATORY_CARE_PROVIDER_SITE_OTHER): Payer: 59 | Admitting: Medical-Surgical

## 2021-04-15 ENCOUNTER — Other Ambulatory Visit: Payer: Self-pay

## 2021-04-15 ENCOUNTER — Encounter: Payer: Self-pay | Admitting: Medical-Surgical

## 2021-04-15 VITALS — BP 150/98 | HR 76 | Resp 20 | Ht 71.0 in | Wt 220.3 lb

## 2021-04-15 DIAGNOSIS — R079 Chest pain, unspecified: Secondary | ICD-10-CM

## 2021-04-15 DIAGNOSIS — M940 Chondrocostal junction syndrome [Tietze]: Secondary | ICD-10-CM

## 2021-04-15 DIAGNOSIS — I10 Essential (primary) hypertension: Secondary | ICD-10-CM | POA: Diagnosis not present

## 2021-04-15 MED ORDER — MELOXICAM 15 MG PO TABS: 15.0000 mg | ORAL_TABLET | Freq: Every day | ORAL | 0 refills | Status: DC

## 2021-04-15 MED ORDER — PREDNISONE 10 MG (48) PO TBPK: ORAL_TABLET | Freq: Every day | ORAL | 0 refills | Status: DC

## 2021-04-15 MED ORDER — VALSARTAN-HYDROCHLOROTHIAZIDE 160-25 MG PO TABS
1.0000 | ORAL_TABLET | Freq: Every day | ORAL | 1 refills | Status: DC
Start: 2021-04-15 — End: 2021-06-13

## 2021-04-15 MED ORDER — DICLOFENAC SODIUM 1 % EX GEL
4.0000 g | Freq: Four times a day (QID) | CUTANEOUS | 0 refills | Status: DC
Start: 2021-04-15 — End: 2022-09-07

## 2021-04-15 MED ORDER — LIDOCAINE 5 % EX PTCH: 1.0000 | MEDICATED_PATCH | Freq: Two times a day (BID) | CUTANEOUS | 2 refills | Status: DC

## 2021-04-15 NOTE — Patient Instructions (Signed)
Costochondritis  Costochondritis is inflammation of the tissue (cartilage) that connects the ribs to the breastbone (sternum). This causes pain in the front of the chest. The pain usually starts slowlyand involves more than one rib. What are the causes? The exact cause of this condition is not always known. It results from stress on the cartilage where your ribs attach to your sternum. The cause of this stress could be: Chest injury. Exercise or activity, such as lifting. Severe coughing. What increases the risk? You are more likely to develop this condition if you: Are male. Are 30-40 years old. Recently started a new exercise or work activity. Have low levels of vitamin D. Have a condition that makes you cough frequently. What are the signs or symptoms? The main symptom of this condition is chest pain. The pain: Usually starts gradually and can be sharp or dull. Gets worse with deep breathing, coughing, or exercise. Gets better with rest. May be worse when you press on the affected area of your ribs and sternum. How is this diagnosed? This condition is diagnosed based on your symptoms, your medical history, and a physical exam. Your health care provider will check for pain when pressing on your sternum. You may also have tests to rule out other causes of chest pain. These may include: A chest X-ray to check for lung problems. An ECG (electrocardiogram) to see if you have a heart problem that could be causing the pain. An imaging scan to rule out a chest or rib fracture. How is this treated? This condition usually goes away on its own over time. Your health care provider may prescribe an NSAID, such as ibuprofen, to reduce pain and inflammation. Treatment may also include: Resting and avoiding activities that make pain worse. Applying heat or ice to the area to reduce pain and inflammation. Doing exercises to stretch your chest muscles. If these treatments do not help, your health  care provider may inject a numbingmedicine at the sternum-rib connection to help relieve the pain. Follow these instructions at home: Managing pain, stiffness, and swelling     If directed, put ice on the painful area. To do this: Put ice in a plastic bag. Place a towel between your skin and the bag. Leave the ice on for 20 minutes, 2-3 times a day. If directed, apply heat to the affected area as often as told by your health care provider. Use the heat source that your health care provider recommends, such as a moist heat pack or a heating pad. Place a towel between your skin and the heat source. Leave the heat on for 20-30 minutes. Remove the heat if your skin turns bright red. This is especially important if you are unable to feel pain, heat, or cold. You may have a greater risk of getting burned. Activity Rest as told by your health care provider. Avoid activities that make pain worse. This includes any activities that use chest, abdominal, and side muscles. Do not lift anything that is heavier than 10 lb (4.5 kg), or the limit that you are told, until your health care provider says that it is safe. Return to your normal activities as told by your health care provider. Ask your health care provider what activities are safe for you. General instructions Take over-the-counter and prescription medicines only as told by your health care provider. Keep all follow-up visits as told by your health care provider. This is important. Contact a health care provider if: You have chills or a   fever. Your pain does not go away or it gets worse. You have a cough that does not go away. Get help right away if: You have shortness of breath. You have severe chest pain that is not relieved by medicines, heat, or ice. These symptoms may represent a serious problem that is an emergency. Do not wait to see if the symptoms will go away. Get medical help right away. Call your local emergency services (911 in  the [place] Do not drive yourself to the hospital. Summary Costochondritis is inflammation of the tissue (cartilage) that connects the ribs to the breastbone (sternum). This condition causes pain in the front of the chest. Costochondritis results from stress on the cartilage where your ribs attach to your sternum. Treatment may include medicines, rest, heat or ice, and exercises. This information is not intended to replace advice given to you by your health care provider. Make sure you discuss any questions you have with your healthcare provider. Document Revised: 07/11/2019 Document Reviewed: 07/11/2019 Elsevier Patient Education  2022 Elsevier Inc.  

## 2021-05-04 ENCOUNTER — Ambulatory Visit (INDEPENDENT_AMBULATORY_CARE_PROVIDER_SITE_OTHER): Payer: 59

## 2021-05-04 ENCOUNTER — Encounter: Payer: Self-pay | Admitting: Emergency Medicine

## 2021-05-04 ENCOUNTER — Ambulatory Visit
Admission: EM | Admit: 2021-05-04 | Discharge: 2021-05-04 | Disposition: A | Discharge status: Home / Self Care | Payer: 59 | Attending: Urgent Care | Admitting: Urgent Care

## 2021-05-04 ENCOUNTER — Other Ambulatory Visit: Payer: Self-pay

## 2021-05-04 DIAGNOSIS — M79642 Pain in left hand: Secondary | ICD-10-CM

## 2021-05-04 DIAGNOSIS — M549 Dorsalgia, unspecified: Secondary | ICD-10-CM | POA: Diagnosis not present

## 2021-05-04 DIAGNOSIS — M542 Cervicalgia: Secondary | ICD-10-CM

## 2021-05-04 DIAGNOSIS — S60222A Contusion of left hand, initial encounter: Secondary | ICD-10-CM

## 2021-05-04 DIAGNOSIS — M25561 Pain in right knee: Secondary | ICD-10-CM

## 2021-05-04 DIAGNOSIS — M545 Low back pain, unspecified: Secondary | ICD-10-CM

## 2021-05-04 MED ORDER — TIZANIDINE HCL 4 MG PO TABS: 4.0000 mg | ORAL_TABLET | Freq: Every day | ORAL | 0 refills | Status: DC
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
## 2021-05-17 ENCOUNTER — Ambulatory Visit: Payer: 59 | Admitting: Sports Medicine

## 2021-05-23 ENCOUNTER — Ambulatory Visit: Payer: 59 | Admitting: Sports Medicine

## 2021-05-24 ENCOUNTER — Ambulatory Visit (INDEPENDENT_AMBULATORY_CARE_PROVIDER_SITE_OTHER): Payer: 59 | Admitting: Sports Medicine

## 2021-05-24 ENCOUNTER — Ambulatory Visit: Payer: 59 | Admitting: Sports Medicine

## 2021-05-24 ENCOUNTER — Other Ambulatory Visit: Payer: Self-pay

## 2021-05-24 ENCOUNTER — Ambulatory Visit (INDEPENDENT_AMBULATORY_CARE_PROVIDER_SITE_OTHER): Payer: 59

## 2021-05-24 ENCOUNTER — Encounter: Payer: Self-pay | Admitting: Sports Medicine

## 2021-05-24 DIAGNOSIS — M65342 Trigger finger, left ring finger: Secondary | ICD-10-CM

## 2021-05-24 DIAGNOSIS — R079 Chest pain, unspecified: Secondary | ICD-10-CM | POA: Diagnosis not present

## 2021-05-24 DIAGNOSIS — I10 Essential (primary) hypertension: Secondary | ICD-10-CM | POA: Diagnosis not present

## 2021-05-24 LAB — COMPREHENSIVE METABOLIC PANEL
AG Ratio: 1.5 (calc) (ref 1.0–2.5)
ALT: 28 U/L (ref 9–46)
AST: 21 U/L (ref 10–40)
Albumin: 4.3 g/dL (ref 3.6–5.1)
Alkaline phosphatase (APISO): 75 U/L (ref 36–130)
BUN: 15 mg/dL (ref 7–25)
CO2: 28 mmol/L (ref 20–32)
Calcium: 9.6 mg/dL (ref 8.6–10.3)
Chloride: 105 mmol/L (ref 98–110)
Creat: 0.82 mg/dL (ref 0.60–1.29)
Globulin: 2.8 g/dL (calc) (ref 1.9–3.7)
Glucose, Bld: 96 mg/dL (ref 65–99)
Potassium: 4.3 mmol/L (ref 3.5–5.3)
Sodium: 142 mmol/L (ref 135–146)
Total Bilirubin: 0.5 mg/dL (ref 0.2–1.2)
Total Protein: 7.1 g/dL (ref 6.1–8.1)

## 2021-05-24 LAB — CK TOTAL AND CKMB (NOT AT ARMC)
CK, MB: <0.7 ng/mL (ref 0–5.0)
Total CK: 197 U/L — ABNORMAL HIGH (ref 44–196)

## 2021-05-24 LAB — CBC
HCT: 42.6 % (ref 38.5–50.0)
Hemoglobin: 14.2 g/dL (ref 13.2–17.1)
MCH: 27 pg (ref 27.0–33.0)
MCHC: 33.3 g/dL (ref 32.0–36.0)
MCV: 81 fL (ref 80.0–100.0)
MPV: 9.6 fL (ref 7.5–12.5)
Platelets: 269 10*3/uL (ref 140–400)
RBC: 5.26 10*6/uL (ref 4.20–5.80)
RDW: 14.4 % (ref 11.0–15.0)
WBC: 5.2 10*3/uL (ref 3.8–10.8)

## 2021-05-24 LAB — TROPONIN I: Troponin I: 3 ng/L (ref <=47)

## 2021-05-24 LAB — TSH: TSH: 1 mIU/L (ref 0.40–4.50)

## 2021-05-24 LAB — D-DIMER, QUANTITATIVE: D-Dimer, Quant: 0.26 mcg/mL FEU (ref <0.50)

## 2021-05-24 LAB — BRAIN NATRIURETIC PEPTIDE: Brain Natriuretic Peptide: 15 pg/mL (ref <100)

## 2021-05-24 NOTE — Assessment & Plan Note (Signed)
Blood pressure still elevated today on current medications, he would like to try 2 weeks to get it down, he does not have any symptoms, if persistently elevated we will increase to valsartan/HCTZ 320/25.

## 2021-05-24 NOTE — Assessment & Plan Note (Signed)
We will start conservatively, we did discuss the anatomy and pathophysiology.
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
## 2021-05-27 ENCOUNTER — Telehealth (HOSPITAL_COMMUNITY): Payer: Self-pay | Admitting: *Deleted

## 2021-05-27 NOTE — Telephone Encounter (Signed)
Close encounter 

## 2021-05-31 ENCOUNTER — Other Ambulatory Visit: Payer: Self-pay

## 2021-05-31 ENCOUNTER — Ambulatory Visit (HOSPITAL_COMMUNITY)
Admission: RE | Admit: 2021-05-31 | Discharge: 2021-05-31 | Disposition: A | Discharge status: Home / Self Care | Payer: 59 | Source: Ambulatory Visit | Attending: Cardiology | Admitting: Cardiology

## 2021-05-31 DIAGNOSIS — R079 Chest pain, unspecified: Secondary | ICD-10-CM | POA: Insufficient documentation

## 2021-05-31 LAB — EXERCISE TOLERANCE TEST
Angina Index: 0
Duke Treadmill Score: 12
Estimated workload: 13.4
Exercise duration (min): 12 min
Exercise duration (sec): 0 s
MPHR: 179 {beats}/min
Peak HR: 160 {beats}/min
Percent HR: 89 %
Rest HR: 61 {beats}/min
ST Depression (mm): 0 mm

## 2021-06-06 ENCOUNTER — Ambulatory Visit: Payer: 59 | Admitting: Sports Medicine

## 2021-06-09 ENCOUNTER — Ambulatory Visit: Payer: 59 | Admitting: Sports Medicine

## 2021-06-13 ENCOUNTER — Ambulatory Visit (INDEPENDENT_AMBULATORY_CARE_PROVIDER_SITE_OTHER): Payer: 59 | Admitting: Sports Medicine

## 2021-06-13 ENCOUNTER — Other Ambulatory Visit: Payer: Self-pay | Admitting: Sports Medicine

## 2021-06-13 DIAGNOSIS — M65342 Trigger finger, left ring finger: Secondary | ICD-10-CM

## 2021-06-13 DIAGNOSIS — I10 Essential (primary) hypertension: Secondary | ICD-10-CM

## 2021-06-13 DIAGNOSIS — R079 Chest pain, unspecified: Secondary | ICD-10-CM | POA: Diagnosis not present

## 2021-06-13 MED ORDER — MELOXICAM 15 MG PO TABS: 15.0000 mg | ORAL_TABLET | Freq: Every day | ORAL | 0 refills | Status: DC

## 2021-06-13 NOTE — Assessment & Plan Note (Signed)
Blood pressure persistently elevated, Gregory Willis continues to be inconsistent with his medication. He will do his valsartan/HCTZ 160/25 twice daily and then we can recheck in 2 weeks

## 2021-06-13 NOTE — Progress Notes (Signed)
    Procedures performed today:    None.  Independent interpretation of notes and tests performed by another provider:   None.  Brief History, Exam, Impression, and Recommendations:    Chest pain Stress test was unremarkable, no further intervention needed, we could still consider pericostal injections with ultrasound guidance, in the meantime he will just restart his meloxicam.  Hypertension Blood pressure persistently elevated, [name] continues to be inconsistent with his medication. He will do his valsartan/HCTZ 160/25 twice daily and then we can recheck in 2 weeks  Trigger finger, left ring finger Declines injection, we will watch this for now.  We can certainly do an injection at a follow-up visit if need be.    ___________________________________________ Gregory Willis. Benjamin Stain, M.D., ABFM., CAQSM. Primary Care and Sports Medicine [facility]  Adjunct Instructor of Family Medicine  [employer]

## 2021-06-13 NOTE — Assessment & Plan Note (Signed)
Stress test was unremarkable, no further intervention needed, we could still consider pericostal injections with ultrasound guidance, in the meantime he will just restart his meloxicam.

## 2021-06-13 NOTE — Assessment & Plan Note (Signed)
Declines injection, we will watch this for now.  We can certainly do an injection at a follow-up visit if need be.

## 2021-07-06 ENCOUNTER — Ambulatory Visit: Payer: 59 | Admitting: Sports Medicine

## 2021-10-25 ENCOUNTER — Other Ambulatory Visit: Payer: Self-pay

## 2021-10-25 ENCOUNTER — Ambulatory Visit
Admission: EM | Admit: 2021-10-25 | Discharge: 2021-10-25 | Disposition: A | Discharge status: Home / Self Care | Payer: 59 | Attending: Physician Assistant | Admitting: Physician Assistant

## 2021-10-25 ENCOUNTER — Encounter: Payer: Self-pay | Admitting: Emergency Medicine

## 2021-10-25 DIAGNOSIS — S0991XA Unspecified injury of ear, initial encounter: Secondary | ICD-10-CM

## 2021-10-25 MED ORDER — OFLOXACIN 0.3 % OT SOLN: 5.0000 [drp] | Freq: Two times a day (BID) | OTIC | 0 refills | Status: AC

## 2021-10-25 MED ORDER — FLUTICASONE PROPIONATE 50 MCG/ACT NA SUSP: 1.0000 | Freq: Every day | NASAL | 0 refills | Status: DC

## 2021-10-25 NOTE — ED Provider Notes (Signed)
[facility]    CSN: [id number] Arrival date & time: 10/25/21  1228      History   Chief Complaint Chief Complaint  Patient presents with   Ear Injury    HPI Gregory Willis is a 41 y.o. male.   Patient here today for evaluation of left ear pain that started after he accidentally hit his arm while he was putting a Q-tip into his ear last night.  He states that he did have some bleeding after the accident.  He has noted some dizziness today as well.  He has not had fever.  The history is provided by the patient.   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 No pertinent surgical history.     Home Medications    Prior to Admission medications   Medication Sig Start Date End Date Taking? Authorizing Provider  fluticasone (FLONASE) 50 MCG/ACT nasal spray Place 1 spray into both nostrils daily. 10/25/21  Yes Tomi Bamberger, PA-C  ofloxacin (FLOXIN) 0.3 % OTIC solution Place 5 drops into the left ear 2 (two) times daily for 7 days. 10/25/21 11/01/21 Yes Tomi Bamberger, PA-C  diclofenac Sodium (VOLTAREN) 1 % GEL Apply 4 g topically 4 (four) times daily. To affected joint. 04/15/21   Christen Butter, NP  EPINEPHrine 0.3 mg/0.3 mL IJ SOAJ injection Inject 0.3 mLs (0.3 mg total) into the muscle as needed for anaphylaxis. 04/22/19   Monica Becton, MD  lidocaine (LIDODERM) 5 % Place 1 patch onto the skin every 12 (twelve) hours. Remove & Discard patch within 12 hours or as directed by MD 04/15/21   Christen Butter, NP  meloxicam (MOBIC) 15 MG tablet  Take 1 tablet (15 mg total) by mouth daily. 06/13/21   Monica Becton, MD  tiZANidine (ZANAFLEX) 4 MG tablet Take 1 tablet (4 mg total) by mouth at bedtime. 05/04/21   Wallis Bamberg, PA-C  valsartan-hydrochlorothiazide (DIOVAN-HCT) 160-25 MG tablet Take 1 tablet by mouth in the morning and at bedtime. 06/13/21   Monica Becton, MD    Family History Family History  Problem Relation Age of Onset   Cancer Mother    Heart disease Mother    Hypertension Mother    Hyperlipidemia Mother    Stroke Mother    Diabetes Mother    Diabetes Maternal Aunt    Hyperlipidemia Maternal Aunt    Hypertension Maternal Aunt    Diabetes Maternal Uncle    Diabetes Maternal Grandmother    Hypertension Maternal Grandmother    Stroke Maternal Grandmother    Diabetes Maternal Grandfather    Hypertension Maternal Grandfather    Stroke Maternal Grandfather     Social History Social History   Tobacco Use   Smoking status: Never   Smokeless tobacco: Never  Vaping Use   Vaping Use: Never used  Substance Use Topics   Alcohol use: Yes    Comment: socially   Drug use: No     Allergies   Strawberry extract   Review of Systems Review of Systems  Constitutional:  Negative for chills and fever.  HENT:  Positive for ear pain. Negative for congestion and sore throat.   Eyes:  Negative for discharge and redness.  Respiratory:  Negative for cough and shortness of breath.   Gastrointestinal:  Positive for nausea. Negative for abdominal pain and vomiting.    Physical Exam Triage Vital Signs ED Triage Vitals  Enc Vitals Group     BP      Pulse      Resp      Temp      Temp src      SpO2      Weight      Height      Head Circumference      Peak Flow      Pain Score      Pain Loc      Pain Edu?      Excl. in GC?    No data found.  Updated Vital Signs BP 128/88 (BP Location: Left Arm)    Pulse 100    Temp 98.4 F (36.9 C) (Oral)    Resp 18    SpO2 96%    Physical Exam Vitals  and nursing note reviewed.  Constitutional:      General: He is not in acute distress.    Appearance: Normal appearance. He is not ill-appearing.  HENT:     Head: Normocephalic and atraumatic.     Right Ear: Tympanic membrane normal.     Left Ear: Tympanic membrane normal.     Ears:     Comments: Left EAC with dried blood noted    Nose: Nose normal. No congestion.  Eyes:     Conjunctiva/sclera: Conjunctivae normal.  Cardiovascular:     Rate and Rhythm: Normal rate.  Pulmonary:     Effort: Pulmonary effort is normal. No respiratory distress.     Breath sounds: No rales.  Skin:    General: Skin is warm and dry.  Neurological:     Mental Status: He is alert.  Psychiatric:        Mood and Affect: Mood normal.        Thought Content: Thought content normal.     UC Treatments / Results  Labs (all labs ordered are listed, but only abnormal results are displayed) Labs Reviewed - No data to display  EKG   Radiology No results found.  Procedures Procedures (including critical care time)  Medications Ordered in UC Medications - No data to display  Initial Impression / Assessment and Plan / UC Course  I have reviewed the triage vital signs and the nursing notes.  Pertinent labs & imaging results that were available during my care of the patient were reviewed by me and considered in my medical decision making (see chart for details).    Discussed that tympanic membrane was intact, and trauma appears to be limited to auditory canal.  We will treat with antibiotic drops to ensure no secondary infection.  Flonase prescribed to help with suspected eustachian tube dysfunction causing dizziness.  Final Clinical Impressions(s) / UC Diagnoses   Final diagnoses:  Trauma of ear canal, initial encounter   Discharge Instructions   None    ED Prescriptions     Medication Sig Dispense Auth. Provider   ofloxacin (FLOXIN) 0.3 % OTIC solution Place 5 drops into the left ear 2 (two)  times daily for 7 days. 5 mL Erma Pinto F, PA-C   fluticasone [facility]) 50 MCG/ACT nasal spray Place 1 spray  into both nostrils daily. 16 g Tomi Bamberger, PA-C      PDMP not reviewed this encounter.   Tomi Bamberger, PA-C 10/25/21 1429

## 2021-10-25 NOTE — ED Triage Notes (Signed)
Pt here for left ear pain after accidentally knocking a qtip to far in his ear last night; pt sts some bleeding noted and having some dizziness today

## 2021-12-28 ENCOUNTER — Other Ambulatory Visit: Payer: Self-pay

## 2021-12-28 DIAGNOSIS — I10 Essential (primary) hypertension: Secondary | ICD-10-CM

## 2021-12-28 MED ORDER — VALSARTAN-HYDROCHLOROTHIAZIDE 160-25 MG PO TABS: 1.0000 | ORAL_TABLET | Freq: Two times a day (BID) | ORAL | 0 refills | Status: DC

## 2021-12-31 ENCOUNTER — Other Ambulatory Visit: Payer: Self-pay | Admitting: Medical-Surgical

## 2021-12-31 DIAGNOSIS — I10 Essential (primary) hypertension: Secondary | ICD-10-CM

## 2022-01-10 ENCOUNTER — Other Ambulatory Visit: Payer: Self-pay | Admitting: Sports Medicine

## 2022-01-10 DIAGNOSIS — I10 Essential (primary) hypertension: Secondary | ICD-10-CM

## 2022-02-10 ENCOUNTER — Telehealth: Payer: Self-pay

## 2022-02-10 DIAGNOSIS — R079 Chest pain, unspecified: Secondary | ICD-10-CM

## 2022-02-10 DIAGNOSIS — N2889 Other specified disorders of kidney and ureter: Secondary | ICD-10-CM

## 2022-02-10 NOTE — Telephone Encounter (Signed)
It may come back, we likely need to put him on some restricted duty at work, I did order a CT scan sometime ago that had not yet been done, and he also had offered injections around the costal cartilage should this recur.  I think may be the first step would be to go ahead and get that CT done.

## 2022-02-10 NOTE — Telephone Encounter (Signed)
Patient is working in [place]. He had another episode with the severe chest pain and went to urgent care there. They did xrays and gave him prednisone and muscle relaxers which seem to have calmed it down for now. He reports that he cannot lift anything heavy nor do any chest workouts. Patient is concerned that this is going to continue to occur. Please advise.

## 2022-02-15 NOTE — Telephone Encounter (Signed)
New order for CT chest placed, for insurance purposes he did recently have a chest x-ray at the urgent care, he has had multiple medications and greater than 6 weeks of conservative treatment.  We are looking for thoracic wall pathology such as an occult nonhealed rib fracture.  We do not need a cardiologist as the pain does not sound anginal in any way shape or form and I think he will just waste a specialist co-pay.  He has already had a negative stress test indicating no anginal pathology so the problem is not with his heart.  We also need to work harder on his blood pressure when he comes in to see me.

## 2022-02-15 NOTE — Telephone Encounter (Signed)
When he was seen in [place] they told him to see a Cardiologist. He would like to establish with one in this area. Please place referral.

## 2022-02-15 NOTE — Telephone Encounter (Signed)
Patient said he thought the CT chest got denied. Can we check on this and try to get it approved again since the symptoms have recurred?

## 2022-02-15 NOTE — Telephone Encounter (Signed)
According to Amber's vague notes - the CT was denied and the denial letter from the insurance was given to provider. No record of the denial letter scan into patient's chart. Or any information entered on the referral why the imaging study was denied.  Provider will need to place a new order since the last order was in September 2022.

## 2022-02-16 NOTE — Telephone Encounter (Signed)
Pt informed of CT orders.  Pt states that he is scheduled for this tomorrow.  Tiajuana Amass, CMA

## 2022-02-16 NOTE — Telephone Encounter (Signed)
Task completed. The member is not in scope for prior-authorization/notification for the services requested. Case # [id number]. Imaging dept notified.

## 2022-02-17 ENCOUNTER — Ambulatory Visit (INDEPENDENT_AMBULATORY_CARE_PROVIDER_SITE_OTHER): Payer: Commercial Managed Care - PPO

## 2022-02-17 DIAGNOSIS — R079 Chest pain, unspecified: Secondary | ICD-10-CM

## 2022-02-21 DIAGNOSIS — N2889 Other specified disorders of kidney and ureter: Secondary | ICD-10-CM | POA: Insufficient documentation

## 2022-02-21 NOTE — Assessment & Plan Note (Signed)
Ordering ultrasound for further delineation.

## 2022-02-21 NOTE — Telephone Encounter (Signed)
CT chest does show normal chest, there is right renal mass, ultrasound ordered.

## 2022-02-24 ENCOUNTER — Ambulatory Visit (INDEPENDENT_AMBULATORY_CARE_PROVIDER_SITE_OTHER): Payer: Commercial Managed Care - PPO

## 2022-02-24 DIAGNOSIS — N2889 Other specified disorders of kidney and ureter: Secondary | ICD-10-CM

## 2022-06-20 ENCOUNTER — Other Ambulatory Visit: Payer: Self-pay

## 2022-06-20 DIAGNOSIS — I10 Essential (primary) hypertension: Secondary | ICD-10-CM

## 2022-06-20 MED ORDER — VALSARTAN-HYDROCHLOROTHIAZIDE 160-25 MG PO TABS: 1.0000 | ORAL_TABLET | Freq: Two times a day (BID) | ORAL | 1 refills | Status: DC

## 2022-08-25 ENCOUNTER — Encounter: Payer: Commercial Managed Care - PPO | Admitting: Sports Medicine

## 2022-09-07 ENCOUNTER — Encounter: Payer: Self-pay | Admitting: Sports Medicine

## 2022-09-07 ENCOUNTER — Ambulatory Visit (INDEPENDENT_AMBULATORY_CARE_PROVIDER_SITE_OTHER): Payer: Commercial Managed Care - PPO | Admitting: Sports Medicine

## 2022-09-07 VITALS — BP 129/73 | HR 80 | Wt 230.0 lb

## 2022-09-07 DIAGNOSIS — Z23 Encounter for immunization: Secondary | ICD-10-CM | POA: Diagnosis not present

## 2022-09-07 DIAGNOSIS — I10 Essential (primary) hypertension: Secondary | ICD-10-CM | POA: Diagnosis not present

## 2022-09-07 DIAGNOSIS — Z0000 Encounter for general adult medical examination without abnormal findings: Secondary | ICD-10-CM

## 2022-09-07 MED ORDER — VALSARTAN-HYDROCHLOROTHIAZIDE 160-25 MG PO TABS: 1.0000 | ORAL_TABLET | Freq: Two times a day (BID) | ORAL | 3 refills | Status: DC

## 2022-09-07 NOTE — Progress Notes (Signed)
Subjective:    CC: Annual Physical Exam  HPI:  This patient is here for their annual physical  I reviewed the past medical history, family history, social history, surgical history, and allergies today and no changes were needed.  Please see the problem list section below in epic for further details.  Past Medical History: Past Medical History:  Diagnosis Date   Hypertension    Past Surgical History: No past surgical history on file. Social History: Social History   Socioeconomic History   Marital status: Single    Spouse name: Not on file   Number of children: Not on file   Years of education: Not on file   Highest education level: Not on file  Occupational History   Not on file  Tobacco Use   Smoking status: Never   Smokeless tobacco: Never  Vaping Use   Vaping Use: Never used  Substance and Sexual Activity   Alcohol use: Yes    Comment: socially   Drug use: No   Sexual activity: Not Currently  Other Topics Concern   Not on file  Social History Narrative   Not on file   Social Determinants of Health   Financial Resource Strain: Not on file  Food Insecurity: Not on file  Transportation Needs: Not on file  Physical Activity: Not on file  Stress: Not on file  Social Connections: Not on file   Family History: Family History  Problem Relation Age of Onset   Cancer Mother    Heart disease Mother    Hypertension Mother    Hyperlipidemia Mother    Stroke Mother    Diabetes Mother    Diabetes Maternal Aunt    Hyperlipidemia Maternal Aunt    Hypertension Maternal Aunt    Diabetes Maternal Uncle    Diabetes Maternal Grandmother    Hypertension Maternal Grandmother    Stroke Maternal Grandmother    Diabetes Maternal Grandfather    Hypertension Maternal Grandfather    Stroke Maternal Grandfather    Allergies: Allergies  Allergen Reactions   Strawberry Extract Hives   Medications: See med rec.  Review of Systems: No headache, visual changes, nausea,  vomiting, diarrhea, constipation, dizziness, abdominal pain, skin rash, fevers, chills, night sweats, swollen lymph nodes, weight loss, chest pain, body aches, joint swelling, muscle aches, shortness of breath, mood changes, visual or auditory hallucinations.  Objective:    General: Well Developed, well nourished, and in no acute distress.  Neuro: Alert and oriented x3, extra-ocular muscles intact, sensation grossly intact. Cranial nerves II through XII are intact, motor, sensory, and coordinative functions are all intact. HEENT: Normocephalic, atraumatic, pupils equal round reactive to light, neck supple, no masses, no lymphadenopathy, thyroid nonpalpable. Oropharynx, nasopharynx, external ear canals are unremarkable. Skin: Warm and dry, no rashes noted.  Cardiac: Regular rate and rhythm, no murmurs rubs or gallops.  Respiratory: Clear to auscultation bilaterally. Not using accessory muscles, speaking in full sentences.  Abdominal: Soft, nontender, nondistended, positive bowel sounds, no masses, no organomegaly.  Musculoskeletal: Shoulder, elbow, wrist, hip, knee, ankle stable, and with full range of motion.  Impression and Recommendations:    The patient was counselled, risk factors were discussed, anticipatory guidance given.  Annual physical exam Annual physical as above, flu shot today, routine labs ordered. Return to see me in a year fasting.  Benign essential hypertension Uncontrolled today, he has been intermittently compliant with valsartan/HCTZ 160/25 mg twice daily. Refilling medication, he will take it consistently and then send me a diary  of his blood pressures over the next 2 weeks. If blood pressures continue to be uncontrolled on such a high dose of blood pressure medication we will pull the trigger for a sleep study, he does work in [place] 6 months out of the year so we may have to have the sleep study machinery sent to his  hotel.   ____________________________________________ Ihor Austin. Benjamin Stain, M.D., ABFM., CAQSM., AME. Primary Care and Sports Medicine [facility]  Adjunct Professor of Family Medicine  [employer]  Restaurant manager, fast food

## 2022-09-07 NOTE — Assessment & Plan Note (Signed)
Annual physical as above, flu shot today, routine labs ordered. Return to see me in a year fasting.

## 2022-09-07 NOTE — Assessment & Plan Note (Signed)
Uncontrolled today, he has been intermittently compliant with valsartan/HCTZ 160/25 mg twice daily. Refilling medication, he will take it consistently and then send me a diary of his blood pressures over the next 2 weeks. If blood pressures continue to be uncontrolled on such a high dose of blood pressure medication we will pull the trigger for a sleep study, he does work in [place] 6 months out of the year so we may have to have the sleep study machinery sent to his hotel.

## 2022-09-07 NOTE — Addendum Note (Signed)
Addended by: Carren Rang A on: 09/07/2022 09:49 AM   Modules accepted: Orders

## 2022-09-08 LAB — CBC
HCT: 47.4 % (ref 38.5–50.0)
Hemoglobin: 15.7 g/dL (ref 13.2–17.1)
MCH: 26.8 pg — ABNORMAL LOW (ref 27.0–33.0)
MCHC: 33.1 g/dL (ref 32.0–36.0)
MCV: 81 fL (ref 80.0–100.0)
MPV: 9.7 fL (ref 7.5–12.5)
Platelets: 288 10*3/uL (ref 140–400)
RBC: 5.85 10*6/uL — ABNORMAL HIGH (ref 4.20–5.80)
RDW: 13.6 % (ref 11.0–15.0)
WBC: 5.1 10*3/uL (ref 3.8–10.8)

## 2022-09-08 LAB — LIPID PANEL
Cholesterol: 235 mg/dL — ABNORMAL HIGH (ref <200)
HDL: 73 mg/dL (ref >=40)
LDL Cholesterol (Calc): 131 mg/dL (calc) — ABNORMAL HIGH
Non-HDL Cholesterol (Calc): 162 mg/dL (calc) — ABNORMAL HIGH (ref <130)
Total CHOL/HDL Ratio: 3.2 (calc) (ref <5.0)
Triglycerides: 179 mg/dL — ABNORMAL HIGH (ref <150)

## 2022-09-08 LAB — COMPLETE METABOLIC PANEL WITH GFR
AG Ratio: 1.4 (calc) (ref 1.0–2.5)
ALT: 24 U/L (ref 9–46)
AST: 19 U/L (ref 10–40)
Albumin: 4.8 g/dL (ref 3.6–5.1)
Alkaline phosphatase (APISO): 90 U/L (ref 36–130)
BUN: 12 mg/dL (ref 7–25)
CO2: 30 mmol/L (ref 20–32)
Calcium: 10 mg/dL (ref 8.6–10.3)
Chloride: 99 mmol/L (ref 98–110)
Creat: 0.92 mg/dL (ref 0.60–1.29)
Globulin: 3.4 g/dL (calc) (ref 1.9–3.7)
Glucose, Bld: 110 mg/dL — ABNORMAL HIGH (ref 65–99)
Potassium: 4.3 mmol/L (ref 3.5–5.3)
Sodium: 139 mmol/L (ref 135–146)
Total Bilirubin: 0.6 mg/dL (ref 0.2–1.2)
Total Protein: 8.2 g/dL — ABNORMAL HIGH (ref 6.1–8.1)
eGFR: 107 mL/min/{1.73_m2} (ref >=60)

## 2022-09-08 LAB — HEMOGLOBIN A1C
Hgb A1c MFr Bld: 6.3 % of total Hgb — ABNORMAL HIGH (ref <5.7)
Mean Plasma Glucose: 134 mg/dL
eAG (mmol/L): 7.4 mmol/L

## 2022-09-08 LAB — TSH: TSH: 1.65 mIU/L (ref 0.40–4.50)

## 2023-07-19 ENCOUNTER — Ambulatory Visit (INDEPENDENT_AMBULATORY_CARE_PROVIDER_SITE_OTHER): Payer: Commercial Managed Care - PPO | Admitting: Sports Medicine

## 2023-07-19 ENCOUNTER — Encounter: Payer: Self-pay | Admitting: Sports Medicine

## 2023-07-19 VITALS — BP 134/89 | HR 69 | Ht 71.0 in | Wt 208.0 lb

## 2023-07-19 DIAGNOSIS — R7303 Prediabetes: Secondary | ICD-10-CM | POA: Diagnosis not present

## 2023-07-19 DIAGNOSIS — E782 Mixed hyperlipidemia: Secondary | ICD-10-CM | POA: Diagnosis not present

## 2023-07-19 DIAGNOSIS — Z23 Encounter for immunization: Secondary | ICD-10-CM

## 2023-07-19 DIAGNOSIS — Z113 Encounter for screening for infections with a predominantly sexual mode of transmission: Secondary | ICD-10-CM

## 2023-07-19 DIAGNOSIS — R21 Rash and other nonspecific skin eruption: Secondary | ICD-10-CM

## 2023-07-19 DIAGNOSIS — I10 Essential (primary) hypertension: Secondary | ICD-10-CM

## 2023-07-19 DIAGNOSIS — Z0000 Encounter for general adult medical examination without abnormal findings: Secondary | ICD-10-CM

## 2023-07-19 DIAGNOSIS — Z114 Encounter for screening for human immunodeficiency virus [HIV]: Secondary | ICD-10-CM

## 2023-07-19 DIAGNOSIS — M545 Low back pain, unspecified: Secondary | ICD-10-CM

## 2023-07-19 DIAGNOSIS — G8929 Other chronic pain: Secondary | ICD-10-CM

## 2023-07-19 MED ORDER — VALSARTAN-HYDROCHLOROTHIAZIDE 160-25 MG PO TABS: 1.0000 | ORAL_TABLET | Freq: Two times a day (BID) | ORAL | 3 refills | Status: DC

## 2023-07-19 NOTE — Assessment & Plan Note (Signed)
Chronic right sided axial low back pain localized over the quadratus lumborum worse with flexion and motion, nothing radicular. He does have a renal cyst but this is not likely symptomatic, we discussed the treatment plan, adding lumbar spine x-rays, home physical therapy, if he is consistent with this I think the back pain will go away.

## 2023-07-19 NOTE — Assessment & Plan Note (Addendum)
Fasting annual physical above, flu shot, routine labs, return in a year. 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 MSM, screening for HIV. We discussed PrEP, patient declines.

## 2023-07-19 NOTE — Addendum Note (Signed)
Addended by: Carren Rang A on: 07/19/2023 11:18 AM   Modules accepted: Orders

## 2023-07-19 NOTE — Progress Notes (Addendum)
Subjective:    CC: Annual Physical Exam  HPI:  This patient is here for their annual physical  I reviewed the past medical history, family history, social history, surgical history, and allergies today and no changes were needed.  Please see the problem list section below in epic for further details.  Past Medical History: Past Medical History:  Diagnosis Date   Hypertension    Past Surgical History: No past surgical history on file. Social History: Social History   Socioeconomic History   Marital status: Single    Spouse name: Not on file   Number of children: Not on file   Years of education: Not on file   Highest education level: Not on file  Occupational History   Not on file  Tobacco Use   Smoking status: Never   Smokeless tobacco: Never  Vaping Use   Vaping status: Never Used  Substance and Sexual Activity   Alcohol use: Yes    Comment: socially   Drug use: No   Sexual activity: Not Currently  Other Topics Concern   Not on file  Social History Narrative   Not on file   Social Determinants of Health   Financial Resource Strain: Not on file  Food Insecurity: Not on file  Transportation Needs: Not on file  Physical Activity: Not on file  Stress: Not on file  Social Connections: Not on file   Family History: Family History  Problem Relation Age of Onset   Cancer Mother    Heart disease Mother    Hypertension Mother    Hyperlipidemia Mother    Stroke Mother    Diabetes Mother    Diabetes Maternal Aunt    Hyperlipidemia Maternal Aunt    Hypertension Maternal Aunt    Diabetes Maternal Uncle    Diabetes Maternal Grandmother    Hypertension Maternal Grandmother    Stroke Maternal Grandmother    Diabetes Maternal Grandfather    Hypertension Maternal Grandfather    Stroke Maternal Grandfather    Allergies: Allergies  Allergen Reactions   Strawberry Extract Hives   Medications: See med rec.  Review of Systems: No headache, visual changes,  nausea, vomiting, diarrhea, constipation, dizziness, abdominal pain, skin rash, fevers, chills, night sweats, swollen lymph nodes, weight loss, chest pain, body aches, joint swelling, muscle aches, shortness of breath, mood changes, visual or auditory hallucinations.  Objective:    General: Well Developed, well nourished, and in no acute distress.  Neuro: Alert and oriented x3, extra-ocular muscles intact, sensation grossly intact. Cranial nerves II through XII are intact, motor, sensory, and coordinative functions are all intact. HEENT: Normocephalic, atraumatic, pupils equal round reactive to light, neck supple, no masses, no lymphadenopathy, thyroid nonpalpable. Oropharynx, nasopharynx, external ear canals are unremarkable. Skin: Warm and dry, no rashes noted.  Skin tag left upper shoulder Cardiac: Regular rate and rhythm, no murmurs rubs or gallops.  Respiratory: Clear to auscultation bilaterally. Not using accessory muscles, speaking in full sentences.  Abdominal: Soft, nontender, nondistended, positive bowel sounds, no masses, no organomegaly.  Musculoskeletal: Shoulder, elbow, wrist, hip, knee, ankle stable, and with full range of motion.  Impression and Recommendations:    The patient was counselled, risk factors were discussed, anticipatory guidance given.  Annual physical exam Fasting annual physical above, flu shot, routine labs, return in a year. 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 MSM,  screening for HIV. We discussed PrEP, patient declines.  Benign essential hypertension Controlled on current medication, refilling.  Mixed hyperlipidemia Has been doing excellent dietary changes, we will recheck lipids and start a statin if insufficiently improved.  Right-sided low back pain without sciatica Chronic right sided axial low back pain localized  over the quadratus lumborum worse with flexion and motion, nothing radicular. He does have a renal cyst but this is not likely symptomatic, we discussed the treatment plan, adding lumbar spine x-rays, home physical therapy, if he is consistent with this I think the back pain will go away.  Skin rash Intermittent swelling hands, skin rashes, sores not identifiable but occurs with occasional fruits. He is requesting allergy testing, I informed him about the limitations of serum allergy testing and that they could present lots of false positives and and was based mostly in exposure, we will get him set up with an allergist in the hopes of doing prick testing. He does work in [place] and flies back every weekend so we will need an Proofreader in [place].   ____________________________________________ Gregory Willis. Benjamin Stain, M.D., ABFM., CAQSM., AME. Primary Care and Sports Medicine [facility]  Adjunct Professor of Family Medicine  [employer]  Restaurant manager, fast food

## 2023-07-19 NOTE — Assessment & Plan Note (Signed)
Intermittent swelling hands, skin rashes, sores not identifiable but occurs with occasional fruits. He is requesting allergy testing, I informed him about the limitations of serum allergy testing and that they could present lots of false positives and and was based mostly in exposure, we will get him set up with an allergist in the hopes of doing prick testing. He does work in [place] and flies back every weekend so we will need an Proofreader in [place].

## 2023-07-19 NOTE — Addendum Note (Signed)
Addended by: Monica Becton on: 07/19/2023 02:30 PM   Modules accepted: Orders

## 2023-07-19 NOTE — Addendum Note (Signed)
Addended by: Monica Becton on: 07/19/2023 11:09 AM   Modules accepted: Orders

## 2023-07-19 NOTE — Assessment & Plan Note (Signed)
Controlled on current medication, refilling.

## 2023-07-19 NOTE — Assessment & Plan Note (Signed)
Has been doing excellent dietary changes, we will recheck lipids and start a statin if insufficiently improved.

## 2023-07-20 LAB — COMPREHENSIVE METABOLIC PANEL
ALT: 15 [IU]/L (ref 0–44)
AST: 16 [IU]/L (ref 0–40)
Albumin: 4.7 g/dL (ref 4.1–5.1)
Alkaline Phosphatase: 90 [IU]/L (ref 44–121)
BUN/Creatinine Ratio: 20 (ref 9–20)
BUN: 19 mg/dL (ref 6–24)
Bilirubin Total: 0.5 mg/dL (ref 0.0–1.2)
CO2: 24 mmol/L (ref 20–29)
Calcium: 10 mg/dL (ref 8.7–10.2)
Chloride: 99 mmol/L (ref 96–106)
Creatinine, Ser: 0.95 mg/dL (ref 0.76–1.27)
Globulin, Total: 2.9 g/dL (ref 1.5–4.5)
Glucose: 85 mg/dL (ref 70–99)
Potassium: 4.2 mmol/L (ref 3.5–5.2)
Sodium: 138 mmol/L (ref 134–144)
Total Protein: 7.6 g/dL (ref 6.0–8.5)
eGFR: 102 mL/min/{1.73_m2} (ref >59)

## 2023-07-20 LAB — CBC
Hematocrit: 47.9 % (ref 37.5–51.0)
Hemoglobin: 15.4 g/dL (ref 13.0–17.7)
MCH: 27 pg (ref 26.6–33.0)
MCHC: 32.2 g/dL (ref 31.5–35.7)
MCV: 84 fL (ref 79–97)
Platelets: 284 10*3/uL (ref 150–450)
RBC: 5.71 x10E6/uL (ref 4.14–5.80)
RDW: 13.3 % (ref 11.6–15.4)
WBC: 5.3 10*3/uL (ref 3.4–10.8)

## 2023-07-20 LAB — LIPID PANEL
Chol/HDL Ratio: 2.8 ratio (ref 0.0–5.0)
Cholesterol, Total: 192 mg/dL (ref 100–199)
HDL: 69 mg/dL (ref >39)
LDL Chol Calc (NIH): 107 mg/dL — ABNORMAL HIGH (ref 0–99)
Triglycerides: 87 mg/dL (ref 0–149)
VLDL Cholesterol Cal: 16 mg/dL (ref 5–40)

## 2023-07-20 LAB — TSH: TSH: 0.914 u[IU]/mL (ref 0.450–4.500)

## 2023-07-20 LAB — HEMOGLOBIN A1C
Est. average glucose Bld gHb Est-mCnc: 123 mg/dL
Hgb A1c MFr Bld: 5.9 % — ABNORMAL HIGH (ref 4.8–5.6)

## 2023-07-20 LAB — HIV ANTIBODY (ROUTINE TESTING W REFLEX): HIV Screen 4th Generation wRfx: NONREACTIVE

## 2023-07-22 LAB — GC/CHLAMYDIA PROBE AMP
Chlamydia trachomatis, NAA: NEGATIVE
Neisseria Gonorrhoeae by PCR: NEGATIVE

## 2023-08-08 ENCOUNTER — Ambulatory Visit: Payer: Commercial Managed Care - PPO

## 2023-08-08 ENCOUNTER — Encounter: Payer: Self-pay | Admitting: Sports Medicine

## 2023-08-08 ENCOUNTER — Ambulatory Visit (INDEPENDENT_AMBULATORY_CARE_PROVIDER_SITE_OTHER): Payer: Commercial Managed Care - PPO | Admitting: Sports Medicine

## 2023-08-08 DIAGNOSIS — M79672 Pain in left foot: Secondary | ICD-10-CM

## 2023-08-08 DIAGNOSIS — R21 Rash and other nonspecific skin eruption: Secondary | ICD-10-CM | POA: Diagnosis not present

## 2023-08-08 DIAGNOSIS — M25571 Pain in right ankle and joints of right foot: Secondary | ICD-10-CM | POA: Insufficient documentation

## 2023-08-08 NOTE — Progress Notes (Signed)
    Procedures performed today:    None.  Independent interpretation of notes and tests performed by another provider:   None.  Brief History, Exam, Impression, and Recommendations:    Pain in left foot Pleasant 42 year old male, he was doing a step class. The next day he had severe pain left foot with swelling localized over the dorsal lateral midfoot. Nothing at the first MTP. He had trouble walking, was seen in urgent care, he was given an intramuscular injection and told he had gout. Unfortunately continues to have discomfort, albeit better than when he was seen. Today he has no discomfort at the first MTP or ankle joint, he does have discrete tenderness to palpation over the cuboid. I do not think this sounds like gout, with a history of overuse and pain directly over the cuboid this is likely a cuboid stress injury. Adding x-rays, MRI to look for occult fracture. He will wear a cam boot. He does have a trip coming up to [place], I think it would be okay for him to get out of the boot for the couple of nights he is good to be partying and then get back in it.    ____________________________________________ Ihor Austin. Benjamin Stain, M.D., ABFM., CAQSM., AME. Primary Care and Sports Medicine [facility]  Adjunct Professor of Family Medicine  [employer]  Restaurant manager, fast food

## 2023-08-08 NOTE — Assessment & Plan Note (Signed)
Pleasant 42 year old male, he was doing a step class. The next day he had severe pain left foot with swelling localized over the dorsal lateral midfoot. Nothing at the first MTP. He had trouble walking, was seen in urgent care, he was given an intramuscular injection and told he had gout. Unfortunately continues to have discomfort, albeit better than when he was seen. Today he has no discomfort at the first MTP or ankle joint, he does have discrete tenderness to palpation over the cuboid. I do not think this sounds like gout, with a history of overuse and pain directly over the cuboid this is likely a cuboid stress injury. Adding x-rays, MRI to look for occult fracture. He will wear a cam boot. He does have a trip coming up to [place], I think it would be okay for him to get out of the boot for the couple of nights he is good to be partying and then get back in it.

## 2023-08-14 ENCOUNTER — Other Ambulatory Visit: Payer: Self-pay | Admitting: Sports Medicine

## 2023-08-14 ENCOUNTER — Telehealth: Payer: Self-pay

## 2023-08-14 DIAGNOSIS — M79672 Pain in left foot: Secondary | ICD-10-CM

## 2023-08-14 MED ORDER — MELOXICAM 15 MG PO TABS: ORAL_TABLET | ORAL | 3 refills | Status: AC

## 2023-08-14 MED ORDER — TRIAZOLAM 0.25 MG PO TABS: ORAL_TABLET | ORAL | 0 refills | Status: DC

## 2023-08-14 NOTE — Telephone Encounter (Signed)
Calling in meloxicam for pain, he needs to be in the boot until I have a diagnosis from the MRI, he and I already discussed that he can come out of the boot rarely/intermittently for events that he would like to do.

## 2023-08-14 NOTE — Telephone Encounter (Signed)
Task completed. Patient has been updated of provider's recommendation. Patient is aware to stop by the pharmacy for the meloxicam rx. Patient was agreeable with current treatment plan.

## 2023-08-14 NOTE — Telephone Encounter (Signed)
Copied from CRM [id number]. Topic: Clinical - Request for Lab/Test Order >> Aug 13, 2023 11:52 AM Maxwell Marion wrote: Reason for CRM:   Pt called in regards to MR on foot,wants to get it scheduled if he still needs it. Also, would like to know how long does he need to be in boot and wants to know if something can be ordered for pain as well

## 2023-08-14 NOTE — Telephone Encounter (Signed)
Copied from CRM [id number]. Topic: Clinical - Medical Advice >> Aug 13, 2023 12:04 PM Clayton Bibles wrote: Reason for CRM:    He has questions about his left foot boot that he needs to wear. He needs to know for work tomorrow.

## 2023-08-14 NOTE — Telephone Encounter (Signed)
Auth process for MRI completed. Teams msg sent to imaging for scheduling.   Patient is requesting a rx for pain. How long does he need to use the boot? Please advise, thanks.

## 2023-08-18 ENCOUNTER — Ambulatory Visit: Payer: Commercial Managed Care - PPO

## 2023-08-18 DIAGNOSIS — M79672 Pain in left foot: Secondary | ICD-10-CM | POA: Diagnosis not present

## 2023-08-23 ENCOUNTER — Telehealth: Payer: Self-pay

## 2023-08-23 NOTE — Telephone Encounter (Signed)
Yes I think that is fine.  Cumulative time in the boot is what is going to get this to heal.  A day or 2 out here and there should not impact the overall duration of healing.

## 2023-08-23 NOTE — Telephone Encounter (Signed)
Copied from CRM [id number]. Topic: Clinical - Medical Advice >> Aug 21, 2023  9:53 AM Fonda Kinder J wrote: Reason for CRM: Pt wants to know if it's okay to take off his boot for 2 days to work. Pt is requesting a callback at [phone]

## 2023-08-24 NOTE — Telephone Encounter (Signed)
Patient advised.

## 2023-09-02 ENCOUNTER — Encounter: Payer: Self-pay | Admitting: Sports Medicine

## 2023-10-06 IMAGING — CT CT CHEST W/O CM
2 of 4 series · 15 of 36 positions shown, 18 images · non-contrast
Comparison: None Available.

CLINICAL DATA: Anterior chest wall pain, infection or inflammation
suspected. Remote history of trauma.



[Series 2: thorax · axial · 0.82mm/px · z∈[+1076,+1330]mm · 12 of 151 slices shown, 15 images]
[im 12/151  mediastinal]
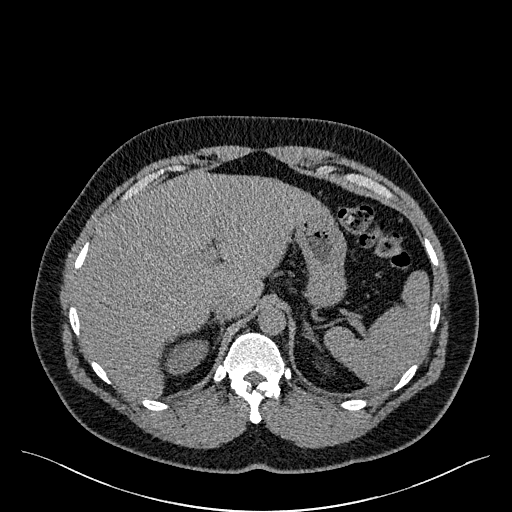
[im 12/151  lung]
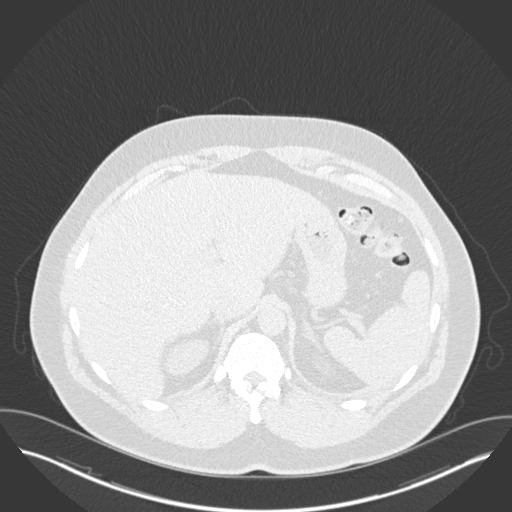
[im 24/151  lung]
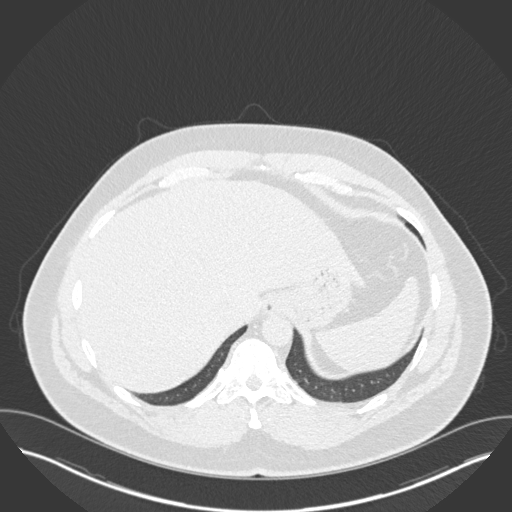
[im 35/151  lung]
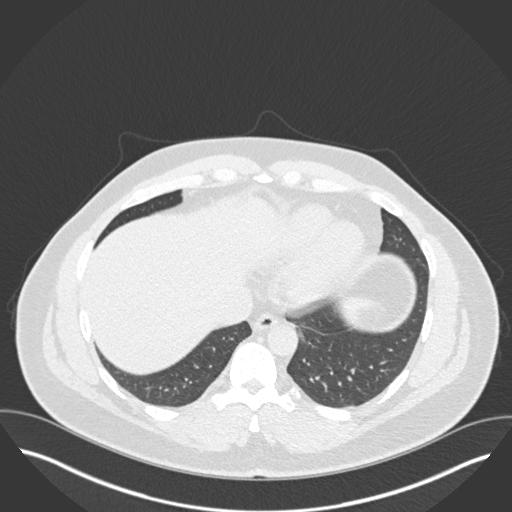
[im 47/151  lung]
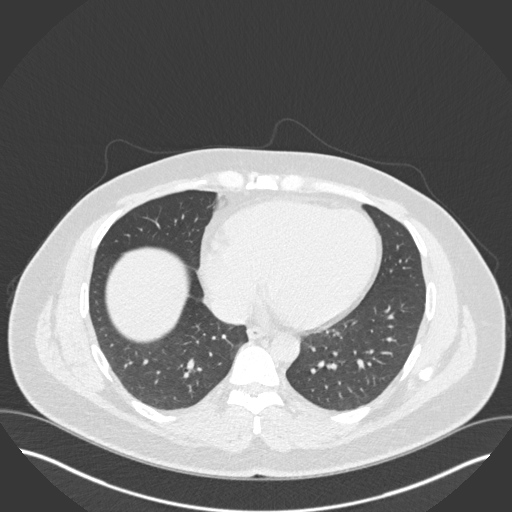
[im 58/151  mediastinal]
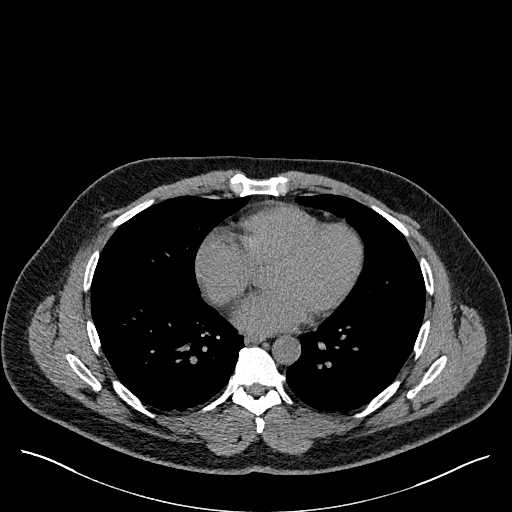
[im 58/151  lung]
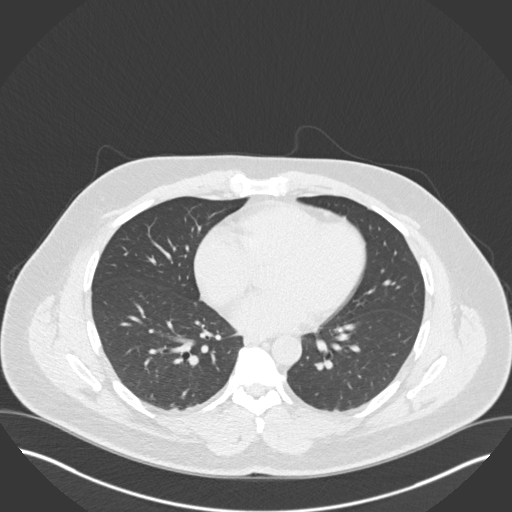
[im 70/151  lung]
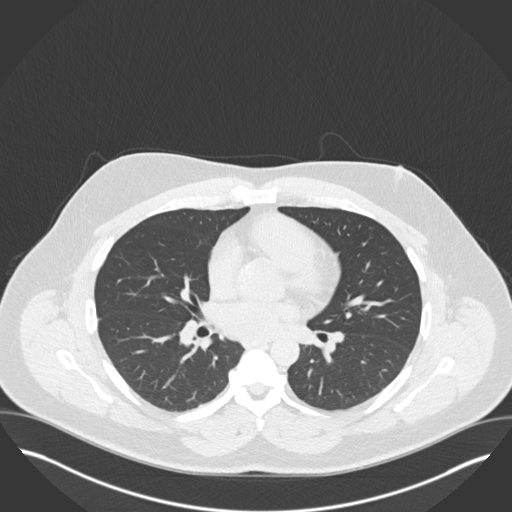
[im 81/151  lung]
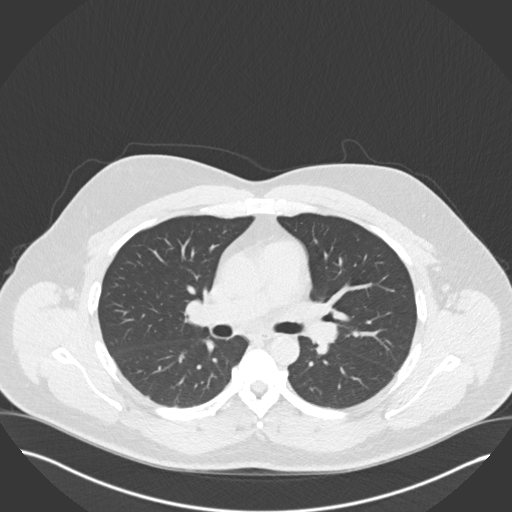
[im 93/151  lung]
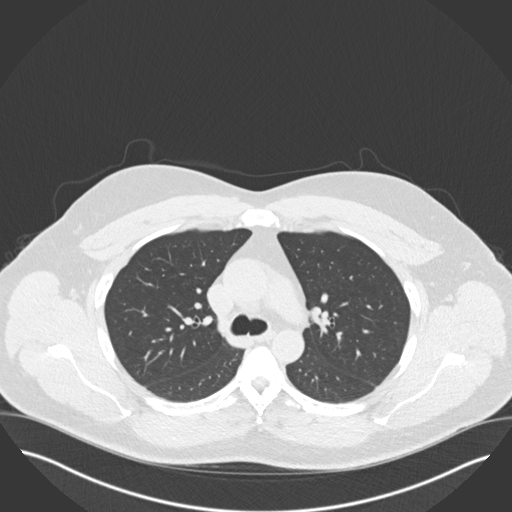
[im 104/151  mediastinal]
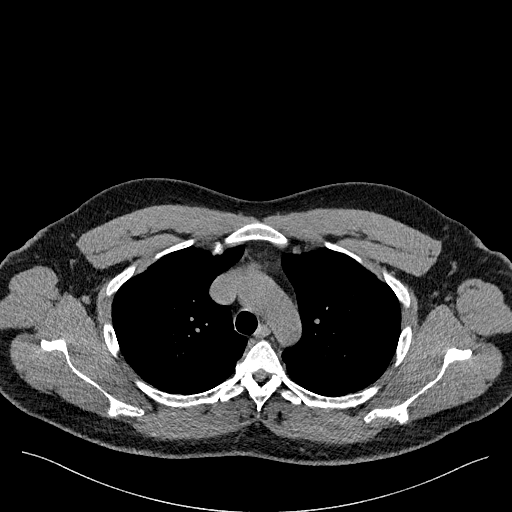
[im 104/151  lung]
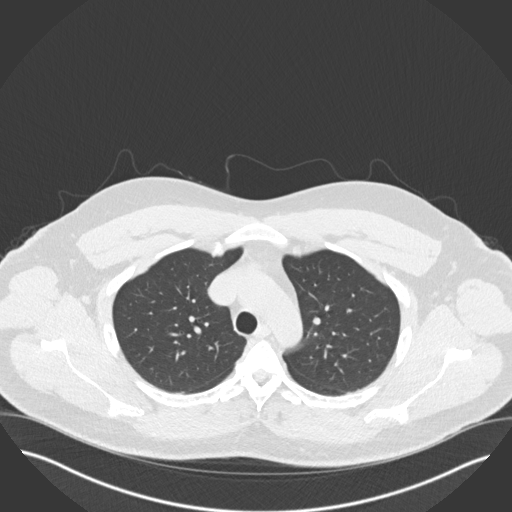
[im 116/151  lung]
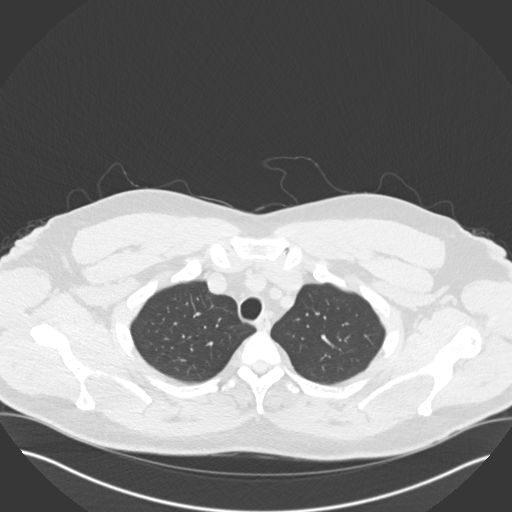
[im 127/151  lung]
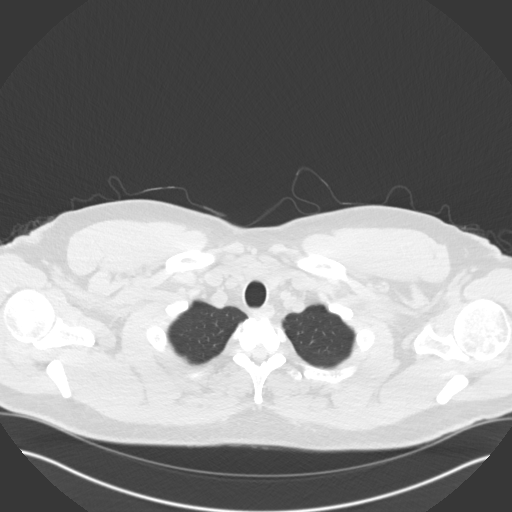
[im 139/151  lung]
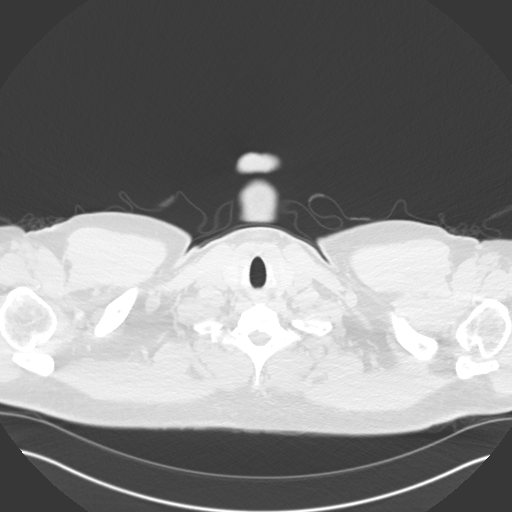

[Series 5: coronal · coronal · 0.59mm/px · 3 of 150 slices shown]
[im 30/150  lung]
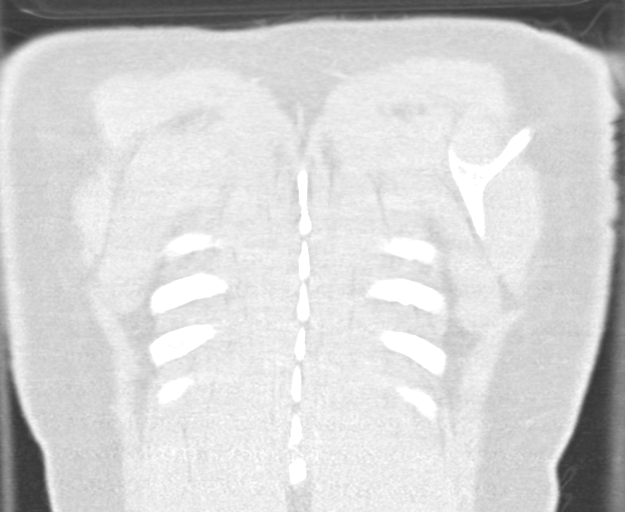
[im 60/150  lung]
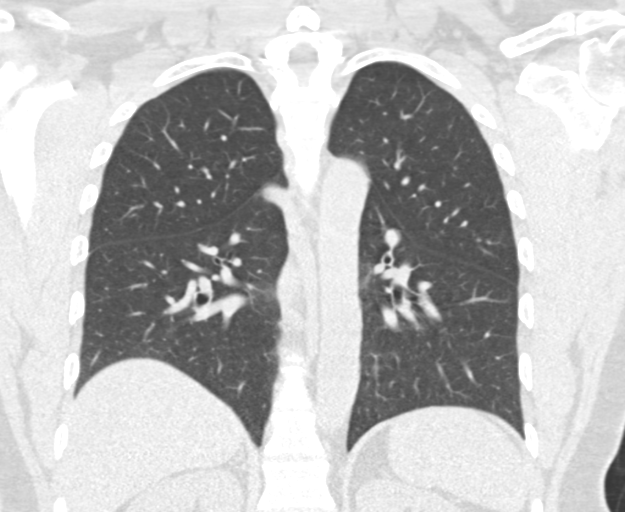
[im 90/150  lung]
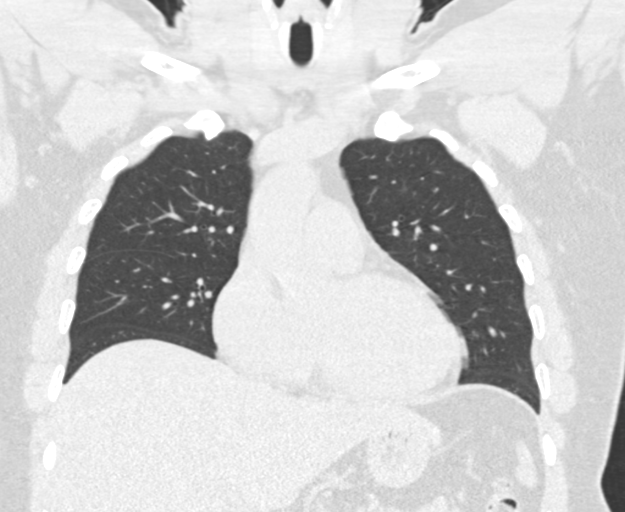

[15 of 36 positions shown; findings below may reference images not displayed]

FINDINGS: Cardiovascular: Heart is normal in size and there is no pericardial
effusion. The aorta and pulmonary trunk are normal in caliber.

Mediastinum/Nodes: No enlarged mediastinal or axillary lymph nodes.
Thyroid gland, trachea, and esophagus demonstrate no significant
findings.

Lungs/Pleura: Minimal atelectasis is noted bilaterally. No
consolidation, effusion, or pneumothorax.

Upper Abdomen: There is a hyperdense nodule in the upper pole of the
right kidney measuring 1.3 cm. No acute abnormality.

Musculoskeletal: No chest wall mass or suspicious bone lesions
identified.
IMPRESSION: 1. No abnormality in the chest to explain anterior chest wall pain.
2. Hyperdense lesion in the upper pole of the right kidney.
Ultrasound is recommended for further characterization on follow-up.

## 2023-10-11 ENCOUNTER — Telehealth: Payer: Self-pay | Admitting: Sports Medicine

## 2023-10-11 DIAGNOSIS — I10 Essential (primary) hypertension: Secondary | ICD-10-CM

## 2023-10-11 MED ORDER — VALSARTAN-HYDROCHLOROTHIAZIDE 160-25 MG PO TABS: 1.0000 | ORAL_TABLET | Freq: Two times a day (BID) | ORAL | 3 refills | Status: AC

## 2023-10-11 NOTE — Telephone Encounter (Signed)
done

## 2023-10-11 NOTE — Telephone Encounter (Signed)
Pt is requesting a refill on his Valsartan. Wants refill sent to [facility](address has been added by agent)

## 2023-10-13 IMAGING — US US RENAL
1 series · 14 of 25 positions shown · non-contrast
Comparison: CT scan of the chest without contrast February 17, 2022

CLINICAL DATA: Right renal lesion identified on CT imaging.

EXAM:
RENAL / URINARY TRACT ULTRASOUND COMPLETE

[Series 1: us renal · 14 of 38 slices shown]
[im 1/38]
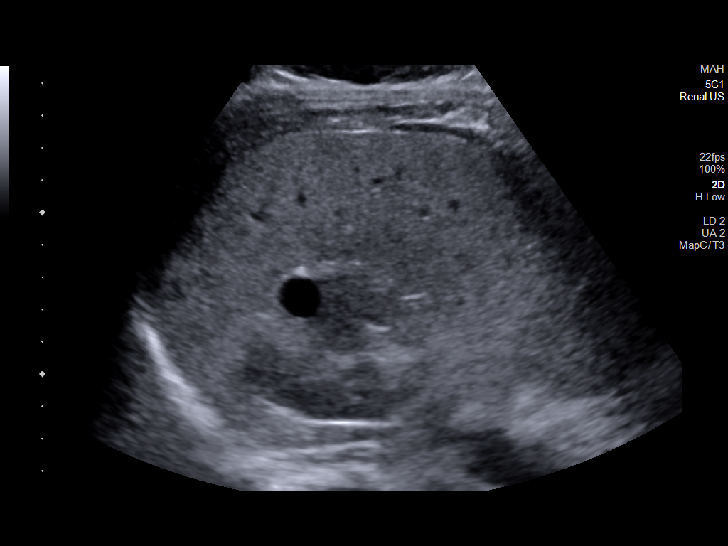
[im 4/38]
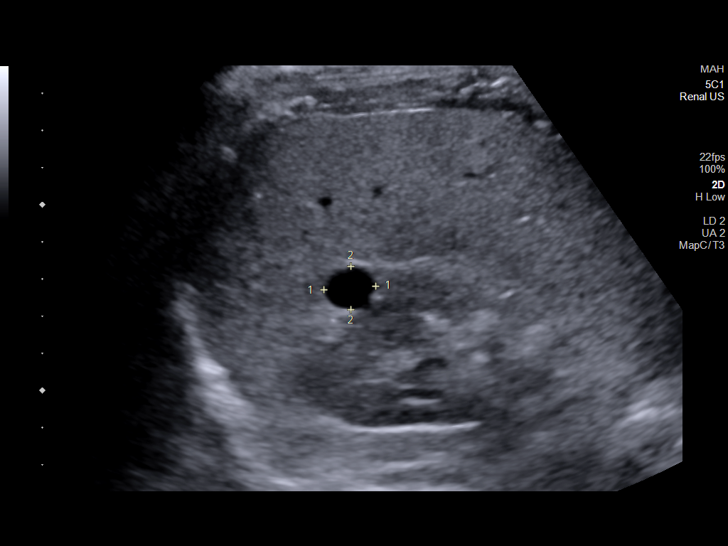
[im 7/38]
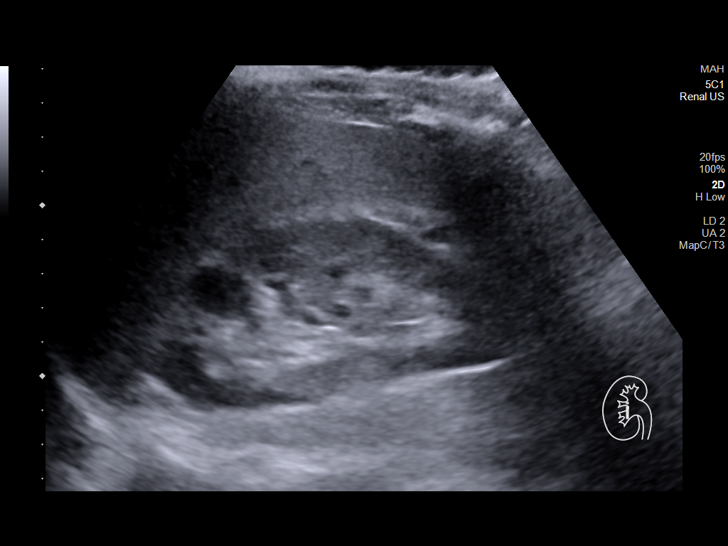
[im 10/38]
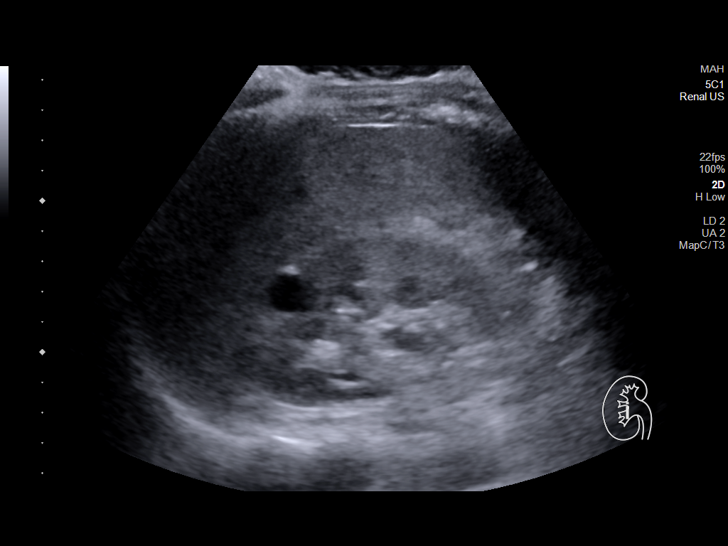
[im 13/38]
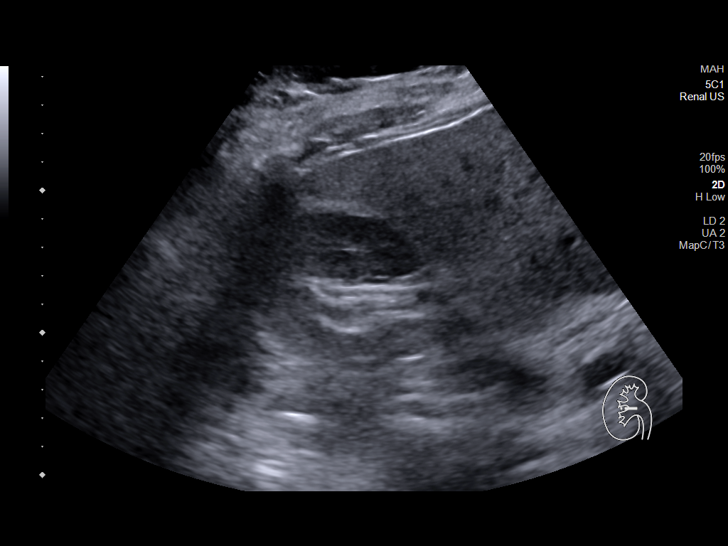
[im 14/38]
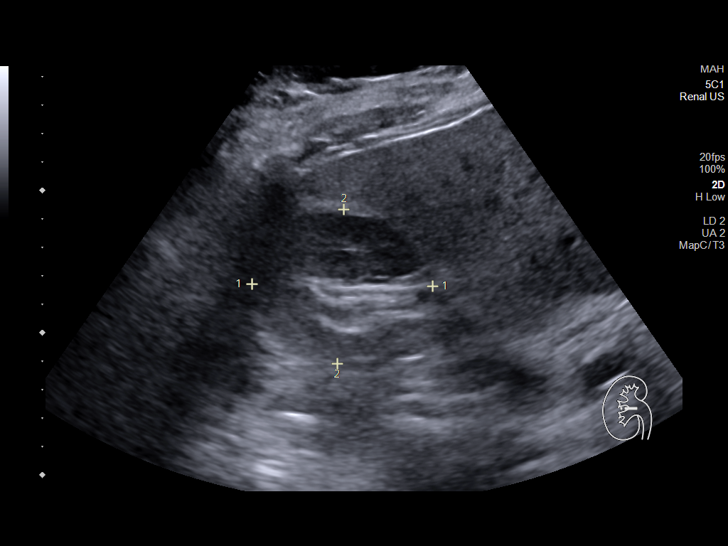
[im 17/38]
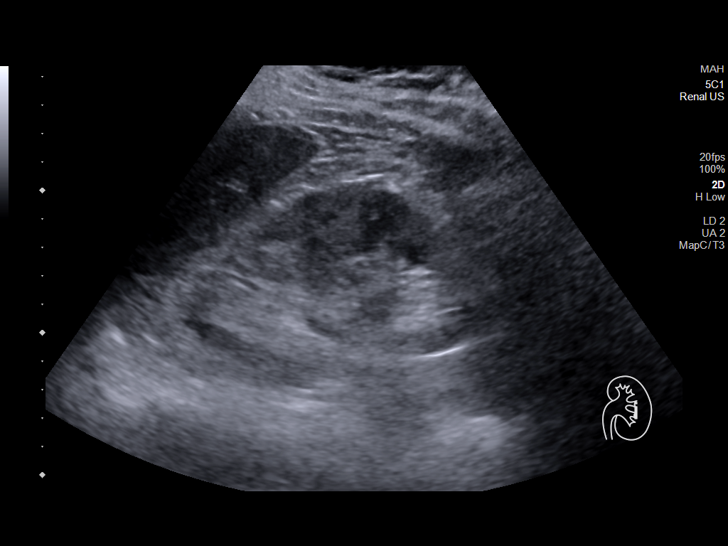
[im 21/38]
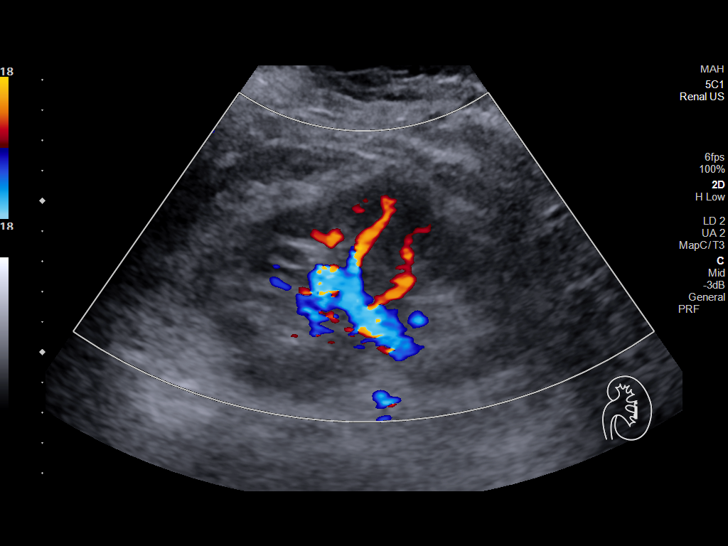
[im 24/38]
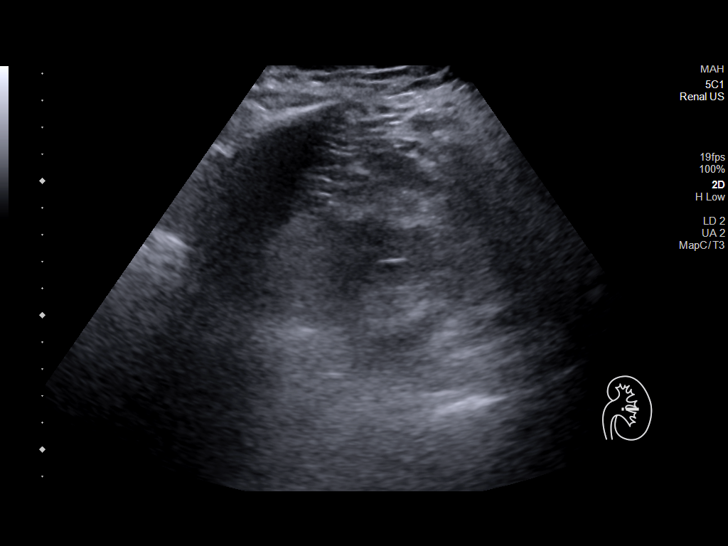
[im 25/38]
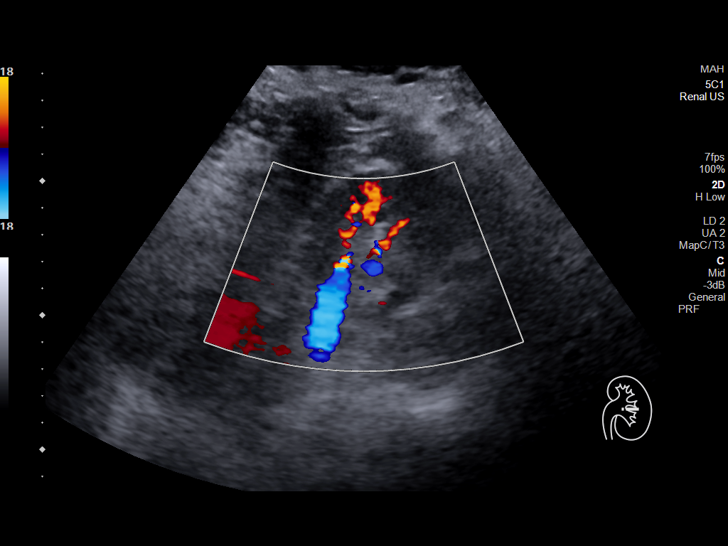
[im 28/38]
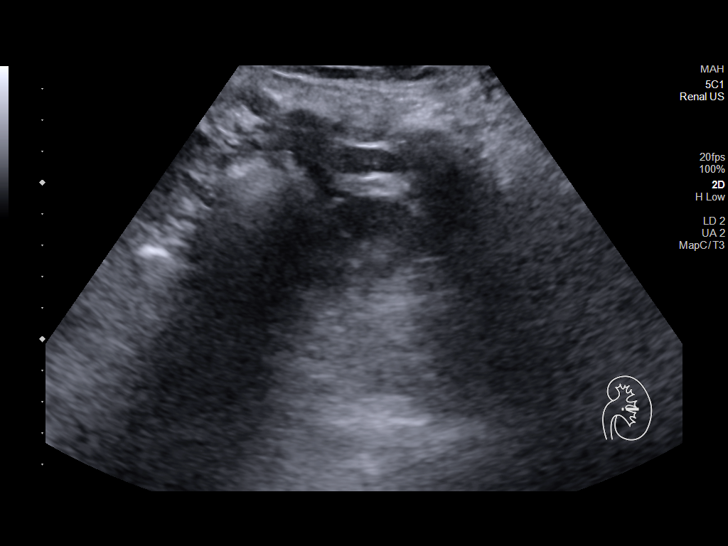
[im 31/38]
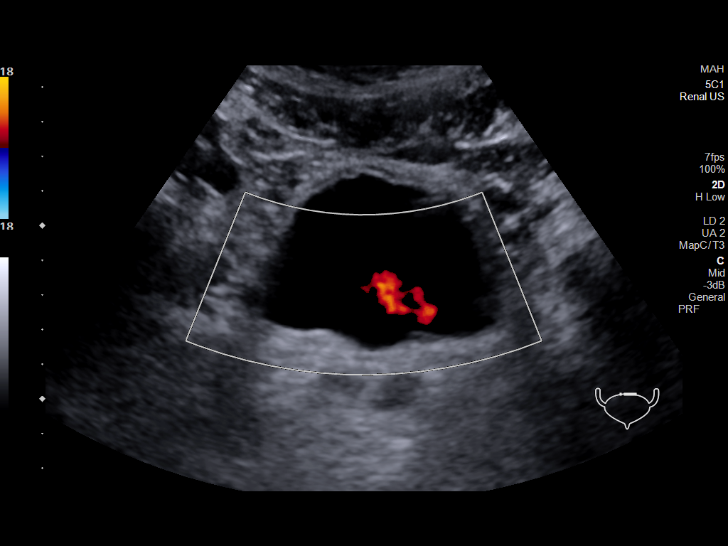
[im 34/38]
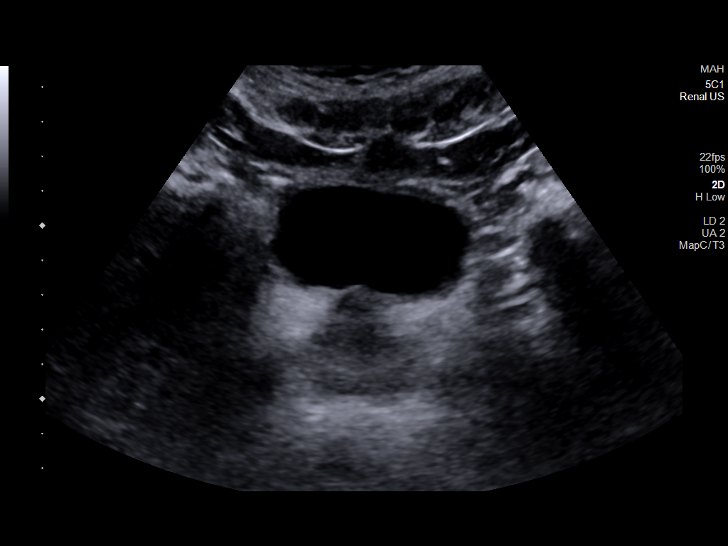
[im 38/38]
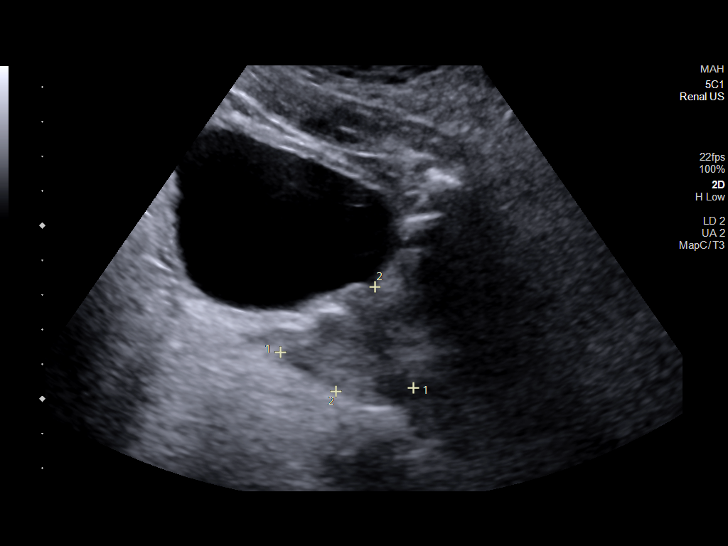

[14 of 25 positions shown; findings below may reference images not displayed]

FINDINGS: Right Kidney:

Renal measurements: 11.0 x 5.4 x 6.4 cm = volume: 199 mL. Contains a
simple appearing cyst in the upper pole measuring 1.4 cm,
correlating with CT findings. No imaging follow-up recommended for
this cyst.

Left Kidney:

Renal measurements: 11.3 x 5.4 x 5.4 cm = volume: 172 mL.
Echogenicity within normal limits. No mass or hydronephrosis
visualized.

Bladder:

Appears normal for degree of bladder distention.

Other:

None.
IMPRESSION: No significant abnormalities. No suspicious masses in either kidney.
The mass identified in the right kidney on recent CT imaging is a
cyst.

## 2023-11-07 NOTE — Telephone Encounter (Unsigned)
 Copied from CRM [id number]. Topic: Clinical - Medical Advice >> Nov 07, 2023  2:26 PM Gildardo Pounds wrote: Reason for CRM: Patient states that ankle is still hurting. He is still using the boot but when he takes it off, it feels like his foot is about to pop. Callback number is [phone]

## 2023-11-08 NOTE — Telephone Encounter (Signed)
 I need to see it in person.

## 2023-11-09 NOTE — Telephone Encounter (Signed)
 Patient scheduled for Monday 11/12/2023

## 2023-11-12 ENCOUNTER — Encounter: Payer: Self-pay | Admitting: Sports Medicine

## 2023-11-12 ENCOUNTER — Ambulatory Visit (INDEPENDENT_AMBULATORY_CARE_PROVIDER_SITE_OTHER): Payer: Commercial Managed Care - PPO | Admitting: Sports Medicine

## 2023-11-12 ENCOUNTER — Ambulatory Visit

## 2023-11-12 DIAGNOSIS — M25572 Pain in left ankle and joints of left foot: Secondary | ICD-10-CM

## 2023-11-12 DIAGNOSIS — M79672 Pain in left foot: Secondary | ICD-10-CM | POA: Diagnosis not present

## 2023-11-12 DIAGNOSIS — Z113 Encounter for screening for infections with a predominantly sexual mode of transmission: Secondary | ICD-10-CM | POA: Diagnosis not present

## 2023-11-12 DIAGNOSIS — M25571 Pain in right ankle and joints of right foot: Secondary | ICD-10-CM | POA: Diagnosis not present

## 2023-11-12 DIAGNOSIS — G8929 Other chronic pain: Secondary | ICD-10-CM

## 2023-11-12 MED ORDER — TRIAZOLAM 0.25 MG PO TABS: ORAL_TABLET | ORAL | 0 refills | Status: AC

## 2023-11-12 NOTE — Assessment & Plan Note (Signed)
 Very pleasant 43 year old male, chronic bilateral ankle pain, initially localized over the cuboid on the left, we placed him in a boot, initially suspected cuboid stress injury, MRI was negative. 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 Unfortunate continues to have pain, for insurance purposes he has had pain greater than 6 weeks in spite of physician directed physical therapy, medications. X-rays will be done today, as his pain is referable more to the tibiotalar joint we will get ankle x-rays and bilateral ankle MRIs. In addition I would like him to get some custom molded orthotics. Follow-up will depend on MRI results.

## 2023-11-12 NOTE — Progress Notes (Signed)
    Procedures performed today:    None.  Independent interpretation of notes and tests performed by another provider:   None.  Brief History, Exam, Impression, and Recommendations:    Screening examination for STD (sexually transmitted disease) [facility] in [place], [place], needs STD testing.  Bilateral ankle pain Very pleasant 43 year old male, chronic bilateral ankle pain, initially localized over the cuboid on the left, we placed him in a boot, initially suspected cuboid stress injury, MRI was negative. 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 Unfortunate continues to have pain, for insurance purposes he has had pain greater than 6 weeks in spite of physician directed physical therapy, medications. X-rays will be done today, as his pain is referable more to the tibiotalar joint we will get ankle x-rays and bilateral ankle MRIs. In addition I would like him to get some custom molded orthotics. Follow-up will depend on MRI results.    ____________________________________________ Ihor Austin. Benjamin Stain, M.D., ABFM., CAQSM., AME. Primary Care and Sports Medicine [facility]  Adjunct Professor of Family Medicine  [employer]  Restaurant manager, [employer]

## 2023-11-12 NOTE — Assessment & Plan Note (Signed)
 [facility] in [place], [place], needs STD testing.

## 2023-11-14 LAB — ACUTE VIRAL HEPATITIS (HAV, HBV, HCV)
HCV Ab: NONREACTIVE
Hep A IgM: NEGATIVE
Hep B C IgM: NEGATIVE
Hepatitis B Surface Ag: NEGATIVE

## 2023-11-14 LAB — RPR+HIV+GC+CT PANEL
Chlamydia trachomatis, NAA: NEGATIVE
HIV Screen 4th Generation wRfx: NONREACTIVE
Neisseria Gonorrhoeae by PCR: NEGATIVE
RPR Ser Ql: NONREACTIVE

## 2023-11-14 LAB — HCV INTERPRETATION

## 2023-11-26 ENCOUNTER — Encounter: Admitting: Family Medicine

## 2024-05-13 ENCOUNTER — Encounter: Payer: Self-pay | Admitting: Sports Medicine

## 2024-07-21 ENCOUNTER — Encounter: Admitting: Urgent Care

## 2024-07-21 ENCOUNTER — Encounter: Admitting: Sports Medicine
